# Patient Record
Sex: Female | Born: 1985 | Race: White | Hispanic: No | Marital: Married | Smoking: Current every day smoker
Health system: Southern US, Community
[De-identification: ages and names within clinical notes are randomized; demographics above are authoritative.]

## PROBLEM LIST (undated history)

## (undated) ENCOUNTER — Inpatient Hospital Stay: Payer: Self-pay

## (undated) DIAGNOSIS — I82890 Acute embolism and thrombosis of other specified veins: Secondary | ICD-10-CM

## (undated) HISTORY — DX: Acute embolism and thrombosis of other specified veins: I82.890

## (undated) HISTORY — PX: OTHER SURGICAL HISTORY: SHX169

---
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
## 2006-08-31 ENCOUNTER — Encounter: Payer: Self-pay | Admitting: Maternal and Fetal Medicine

## 2006-11-22 ENCOUNTER — Observation Stay: Payer: Self-pay | Admitting: Obstetrics and Gynecology

## 2006-11-24 ENCOUNTER — Inpatient Hospital Stay: Payer: Self-pay | Admitting: Obstetrics and Gynecology

## 2008-04-25 ENCOUNTER — Emergency Department: Payer: Self-pay | Admitting: Emergency Medicine

## 2008-05-09 ENCOUNTER — Emergency Department: Payer: Self-pay | Admitting: Emergency Medicine

## 2010-04-19 ENCOUNTER — Emergency Department: Payer: Self-pay | Admitting: Emergency Medicine

## 2011-09-04 ENCOUNTER — Emergency Department: Payer: Self-pay | Admitting: Emergency Medicine

## 2012-09-23 ENCOUNTER — Emergency Department: Payer: Self-pay | Admitting: Emergency Medicine

## 2012-09-23 LAB — BASIC METABOLIC PANEL
Anion Gap: 5 — ABNORMAL LOW (ref 7–16)
BUN: 8 mg/dL (ref 7–18)
Calcium, Total: 8.9 mg/dL (ref 8.5–10.1)
Chloride: 107 mmol/L (ref 98–107)
Co2: 24 mmol/L (ref 21–32)
Creatinine: 0.67 mg/dL (ref 0.60–1.30)
EGFR (African American): >60
EGFR (Non-African Amer.): >60
Glucose: 86 mg/dL (ref 65–99)
Osmolality: 270 (ref 275–301)
Potassium: 4.2 mmol/L (ref 3.5–5.1)
Sodium: 136 mmol/L (ref 136–145)

## 2012-09-23 LAB — CBC
HCT: 40.1 % (ref 35.0–47.0)
HGB: 13.9 g/dL (ref 12.0–16.0)
MCH: 32.7 pg (ref 26.0–34.0)
MCHC: 34.7 g/dL (ref 32.0–36.0)
MCV: 94 fL (ref 80–100)
Platelet: 248 10*3/uL (ref 150–440)
RBC: 4.25 10*6/uL (ref 3.80–5.20)
RDW: 14.1 % (ref 11.5–14.5)
WBC: 8.1 10*3/uL (ref 3.6–11.0)

## 2012-09-23 LAB — DRUG SCREEN, URINE

## 2012-09-23 LAB — TROPONIN I: Troponin-I: <0.02 ng/mL

## 2013-03-25 ENCOUNTER — Emergency Department: Payer: Self-pay | Admitting: Emergency Medicine

## 2013-03-25 LAB — URINALYSIS, COMPLETE
Bilirubin,UR: NEGATIVE
Glucose,UR: NEGATIVE mg/dL (ref 0–75)
Ketone: NEGATIVE
Nitrite: NEGATIVE
Ph: 5 (ref 4.5–8.0)
Protein: 100
RBC,UR: 13 /HPF (ref 0–5)
Specific Gravity: 1.029 (ref 1.003–1.030)
Squamous Epithelial: 31
WBC UR: 124 /HPF (ref 0–5)

## 2013-03-25 LAB — COMPREHENSIVE METABOLIC PANEL
Albumin: 3.7 g/dL (ref 3.4–5.0)
Alkaline Phosphatase: 67 U/L
Anion Gap: 4 — ABNORMAL LOW (ref 7–16)
BUN: 8 mg/dL (ref 7–18)
Bilirubin,Total: 0.7 mg/dL (ref 0.2–1.0)
Calcium, Total: 8.9 mg/dL (ref 8.5–10.1)
Chloride: 99 mmol/L (ref 98–107)
Co2: 27 mmol/L (ref 21–32)
Creatinine: 0.7 mg/dL (ref 0.60–1.30)
EGFR (African American): >60
EGFR (Non-African Amer.): >60
Glucose: 83 mg/dL (ref 65–99)
Osmolality: 258 (ref 275–301)
Potassium: 3.7 mmol/L (ref 3.5–5.1)
SGOT(AST): 16 U/L (ref 15–37)
SGPT (ALT): 12 U/L (ref 12–78)
Sodium: 130 mmol/L — ABNORMAL LOW (ref 136–145)
Total Protein: 8.1 g/dL (ref 6.4–8.2)

## 2013-03-25 LAB — CBC WITH DIFFERENTIAL/PLATELET
Basophil #: 0 10*3/uL (ref 0.0–0.1)
Basophil %: 0.5 %
Eosinophil #: 0 10*3/uL (ref 0.0–0.7)
Eosinophil %: 0.3 %
HCT: 35.9 % (ref 35.0–47.0)
HGB: 12.5 g/dL (ref 12.0–16.0)
Lymphocyte #: 1 10*3/uL (ref 1.0–3.6)
Lymphocyte %: 9.4 %
MCH: 32.4 pg (ref 26.0–34.0)
MCHC: 34.6 g/dL (ref 32.0–36.0)
MCV: 94 fL (ref 80–100)
Monocyte #: 1 x10 3/mm — ABNORMAL HIGH (ref 0.2–0.9)
Monocyte %: 9.1 %
Neutrophil #: 8.5 10*3/uL — ABNORMAL HIGH (ref 1.4–6.5)
Neutrophil %: 80.7 %
Platelet: 214 10*3/uL (ref 150–440)
RBC: 3.84 10*6/uL (ref 3.80–5.20)
RDW: 13.8 % (ref 11.5–14.5)
WBC: 10.6 10*3/uL (ref 3.6–11.0)

## 2013-03-25 LAB — HCG, QUANTITATIVE, PREGNANCY: Beta Hcg, Quant.: 185257 m[IU]/mL — ABNORMAL HIGH

## 2013-03-27 LAB — URINE CULTURE

## 2013-05-31 ENCOUNTER — Observation Stay: Payer: Self-pay | Admitting: Obstetrics and Gynecology

## 2013-10-13 ENCOUNTER — Encounter: Payer: Self-pay | Admitting: Obstetrics & Gynecology

## 2013-10-17 ENCOUNTER — Observation Stay: Payer: Self-pay

## 2013-10-17 LAB — URINALYSIS, COMPLETE
Bacteria: NONE SEEN
Bilirubin,UR: NEGATIVE
Blood: NEGATIVE
Glucose,UR: NEGATIVE mg/dL (ref 0–75)
Ketone: NEGATIVE
Leukocyte Esterase: NEGATIVE
Nitrite: NEGATIVE
Ph: 6 (ref 4.5–8.0)
Protein: NEGATIVE
RBC,UR: 1 /HPF (ref 0–5)
Specific Gravity: 1.019 (ref 1.003–1.030)
Squamous Epithelial: 5
WBC UR: 1 /HPF (ref 0–5)

## 2013-11-15 ENCOUNTER — Inpatient Hospital Stay: Payer: Self-pay | Admitting: Obstetrics & Gynecology

## 2013-11-15 LAB — CBC WITH DIFFERENTIAL/PLATELET
Basophil #: 0.1 10*3/uL (ref 0.0–0.1)
Basophil %: 0.6 %
Eosinophil #: 0 10*3/uL (ref 0.0–0.7)
Eosinophil %: 0.4 %
HCT: 32.1 % — ABNORMAL LOW (ref 35.0–47.0)
HGB: 10.7 g/dL — ABNORMAL LOW (ref 12.0–16.0)
Lymphocyte #: 1.7 10*3/uL (ref 1.0–3.6)
Lymphocyte %: 17.3 %
MCH: 28.8 pg (ref 26.0–34.0)
MCHC: 33.2 g/dL (ref 32.0–36.0)
MCV: 87 fL (ref 80–100)
Monocyte #: 0.9 x10 3/mm (ref 0.2–0.9)
Monocyte %: 9.3 %
Neutrophil #: 7 10*3/uL — ABNORMAL HIGH (ref 1.4–6.5)
Neutrophil %: 72.4 %
Platelet: 172 10*3/uL (ref 150–440)
RBC: 3.7 10*6/uL — ABNORMAL LOW (ref 3.80–5.20)
RDW: 14.7 % — ABNORMAL HIGH (ref 11.5–14.5)
WBC: 9.7 10*3/uL (ref 3.6–11.0)

## 2013-11-15 LAB — GC/CHLAMYDIA PROBE AMP

## 2013-11-17 LAB — HEMATOCRIT: HCT: 29.4 % — ABNORMAL LOW (ref 35.0–47.0)

## 2013-11-25 ENCOUNTER — Observation Stay: Payer: Self-pay | Admitting: Obstetrics & Gynecology

## 2013-11-25 LAB — CBC
HCT: 36.9 % (ref 35.0–47.0)
HGB: 11.8 g/dL — ABNORMAL LOW (ref 12.0–16.0)
MCH: 27.8 pg (ref 26.0–34.0)
MCHC: 31.9 g/dL — ABNORMAL LOW (ref 32.0–36.0)
MCV: 87 fL (ref 80–100)
Platelet: 304 10*3/uL (ref 150–440)
RBC: 4.24 10*6/uL (ref 3.80–5.20)
RDW: 14.8 % — ABNORMAL HIGH (ref 11.5–14.5)
WBC: 13.9 10*3/uL — ABNORMAL HIGH (ref 3.6–11.0)

## 2013-11-25 LAB — COMPREHENSIVE METABOLIC PANEL
Albumin: 3 g/dL — ABNORMAL LOW (ref 3.4–5.0)
Alkaline Phosphatase: 141 U/L — ABNORMAL HIGH
Anion Gap: 8 (ref 7–16)
BUN: 12 mg/dL (ref 7–18)
Bilirubin,Total: 0.4 mg/dL (ref 0.2–1.0)
Calcium, Total: 8.4 mg/dL — ABNORMAL LOW (ref 8.5–10.1)
Chloride: 103 mmol/L (ref 98–107)
Co2: 26 mmol/L (ref 21–32)
Creatinine: 0.83 mg/dL (ref 0.60–1.30)
EGFR (African American): >60
EGFR (Non-African Amer.): >60
Glucose: 129 mg/dL — ABNORMAL HIGH (ref 65–99)
Osmolality: 275 (ref 275–301)
Potassium: 3.7 mmol/L (ref 3.5–5.1)
SGOT(AST): 18 U/L (ref 15–37)
SGPT (ALT): 24 U/L
Sodium: 137 mmol/L (ref 136–145)
Total Protein: 7.2 g/dL (ref 6.4–8.2)

## 2013-11-25 LAB — URINALYSIS, COMPLETE
Bacteria: NONE SEEN
Bilirubin,UR: NEGATIVE
Glucose,UR: NEGATIVE mg/dL (ref 0–75)
Ketone: NEGATIVE
Nitrite: NEGATIVE
Ph: 6 (ref 4.5–8.0)
Protein: NEGATIVE
RBC,UR: 5 /HPF (ref 0–5)
Specific Gravity: 1.015 (ref 1.003–1.030)
Squamous Epithelial: 1
WBC UR: 97 /HPF (ref 0–5)

## 2013-11-25 LAB — WET PREP, GENITAL

## 2013-11-25 LAB — LIPASE, BLOOD: Lipase: 115 U/L (ref 73–393)

## 2013-11-26 LAB — CBC WITH DIFFERENTIAL/PLATELET
Basophil #: 0 x10 3/mm 3
Basophil %: 0.4 %
Eosinophil #: 0.2 x10 3/mm 3
Eosinophil %: 1.6 %
HCT: 31.8 % — ABNORMAL LOW
HGB: 10.4 g/dL — ABNORMAL LOW
Lymphocyte %: 14.5 %
Lymphs Abs: 1.5 x10 3/mm 3
MCH: 28.7 pg
MCHC: 32.8 g/dL
MCV: 87 fL
Monocyte #: 0.8 "x10 3/mm "
Monocyte %: 8 %
Neutrophil #: 7.6 x10 3/mm 3 — ABNORMAL HIGH
Neutrophil %: 75.5 %
Platelet: 255 x10 3/mm 3
RBC: 3.64 X10 6/mm 3 — ABNORMAL LOW
RDW: 14.7 % — ABNORMAL HIGH
WBC: 10.1 x10 3/mm 3

## 2013-11-27 LAB — CBC WITH DIFFERENTIAL/PLATELET
Basophil #: 0 10*3/uL (ref 0.0–0.1)
Basophil %: 0.6 %
Eosinophil #: 0.2 10*3/uL (ref 0.0–0.7)
Eosinophil %: 2.6 %
HCT: 32.8 % — ABNORMAL LOW (ref 35.0–47.0)
HGB: 10.4 g/dL — ABNORMAL LOW (ref 12.0–16.0)
Lymphocyte #: 2 10*3/uL (ref 1.0–3.6)
Lymphocyte %: 24.5 %
MCH: 27.6 pg (ref 26.0–34.0)
MCHC: 31.7 g/dL — ABNORMAL LOW (ref 32.0–36.0)
MCV: 87 fL (ref 80–100)
Monocyte #: 0.7 x10 3/mm (ref 0.2–0.9)
Monocyte %: 8.6 %
Neutrophil #: 5.3 10*3/uL (ref 1.4–6.5)
Neutrophil %: 63.7 %
Platelet: 261 10*3/uL (ref 150–440)
RBC: 3.77 10*6/uL — ABNORMAL LOW (ref 3.80–5.20)
RDW: 14.9 % — ABNORMAL HIGH (ref 11.5–14.5)
WBC: 8.3 10*3/uL (ref 3.6–11.0)

## 2013-11-27 LAB — GC/CHLAMYDIA PROBE AMP

## 2013-11-27 LAB — URINE CULTURE

## 2013-11-30 LAB — CULTURE, BLOOD (SINGLE)

## 2014-04-29 NOTE — H&P (Signed)
Subjective/Chief Complaint -CC: abdominal pain   History of Present Illness -HPI: 29 y/o [id number] PPD 11/16/2013 with above CC. PMHx significant for need for manual extraction of placenta after SVD. Patient states pain came on suddenly at 0600 today and feels pressure like and stable in intensity.   No fevers, chills, n/v/, dysuria and patient with decreasing lochia   Past History none   Primary Physician [facility]   Past Med/Surgical Hx:  denies:   denies:   ALLERGIES:  No Known Allergies:   Family and Social History:  Family History Non-Contributory   Physical Exam:  GEN no acute distress   RESP normal resp effort  clear BS   CARD regular rate  no murmur   ABD +fundal tenderness with fundus below the umbilicus. no reb/guarding, +BS   GU no suprapubic tenderness. speculum exam by er had lochia   Additional Comments no CVAT bilaterally   Lab Results:  LabObservation:  20-Nov-15 08:29   OBSERVATION PACS Image dcm.pi=[id number].sa=[id number]  Hepatic:  20-Nov-15 07:23   Bilirubin, Total 0.4  Alkaline Phosphatase  141 (46-116 NOTE: New Reference Range 07/26/13)  SGPT (ALT) 24 (14-63 NOTE: New Reference Range 07/26/13)  SGOT (AST) 18  Total Protein, Serum 7.2  Albumin, Serum  3.0  Routine BB:  20-Nov-15 07:23   ABO Group + Rh Type O Positive  Antibody Screen NEGATIVE (Result(s) reported on 25 Nov 2013 at 11:23AM.)  Routine Micro:  20-Nov-15 07:45   Micro Text Report WET PREP   COMMENT                   MANY WHITE BLOOD CELLS SEEN   COMMENT                   CLUE CELLS SEEN   COMMENT                   NO SPERMATOZOA SEEN   COMMENT                   NO TRICHOMONAS SEEN   COMMENT                   NO YEAST SEEN   ANTIBIOTIC                       Comment 1. MANY WHITE BLOOD CELLS SEEN  Comment 2. CLUE CELLS SEEN  Comment 3. NO SPERMATOZOA SEEN  Comment 4. NO TRICHOMONAS SEEN  Comment 5. NO YEAST SEEN  Result(s) reported on 25 Nov 2013 at 08:31AM.   Routine Chem:  20-Nov-15 07:23   Lipase 115 (Result(s) reported on 25 Nov 2013 at 07:49AM.)  Glucose, Serum  129  BUN 12  Creatinine (comp) 0.83  Sodium, Serum 137  Potassium, Serum 3.7  Chloride, Serum 103  CO2, Serum 26  Calcium (Total), Serum  8.4  Osmolality (calc) 275  eGFR (African American) >60  eGFR (Non-African American) >60 (eGFR values <78m/min/1.73 m2 may be an indication of chronic kidney disease (CKD). Calculated eGFR, using the MRDR Study equation, is useful in  patients with stable renal function. The eGFR calculation will not be reliable in acutely ill patients when serum creatinine is changing rapidly. It is not useful in patients on dialysis. The eGFR calculation may not be applicable to patients at the low and high extremes of body sizes, pregnant women, and vegetarians.)  Anion Gap 8  Routine Hem:  20-Nov-15 07:23   WBC (  CBC)  13.9  RBC (CBC) 4.24  Hemoglobin (CBC)  11.8  Hematocrit (CBC) 36.9  Platelet Count (CBC) 304 (Result(s) reported on 25 Nov 2013 at 07:40AM.)  MCV 87  MCH 27.8  MCHC  31.9  RDW  14.8   Radiology Results: [place]:    20-Nov-15 08:29, US Pelvis - NON OB  US Pelvis - NON OB  REASON FOR EXAM:    ?retained placenta - SVD 10 days ago, manual   extraction of placenta performed, n  COMMENTS:       PROCEDURE: [place]  - US PELVIS EXAM  - Nov 25 2013  8:29AM     CLINICAL DATA:  Initial encounter for fever and leukocytosis 9 days  after spontaneous vaginal delivery.    EXAM:  TRANSABDOMINAL ULTRASOUND OF PELVIS    TECHNIQUE:  Transabdominal ultrasound examination of the pelvis was performed  including evaluation of the uterus, ovaries, adnexal regions, and  pelvic cul-de-sac.    COMPARISON:  None.    FINDINGS:  Uterus    Measurements: Limb 0.3 x 8.5 x 9.2 cm. No fibroids or other mass  visualized.    Endometrium    Thickness: The endometrial echo complex is markedly thickened at 4.4  cm with heterogeneous material within the  endometrial canal. This is  a combination of hypoechoic and hyper echoic debris. Some areas  appear fairly homogeneous and similar in echogenicity to the  background myometrium. Color Doppler evaluation does not show marked  hypervascularity within the thickened endometrial echo complex, but  retained products can be hypovascular.    Right ovary    Measurements: 4.7 x 2.7 x 2.0 cm. Normal appearance/no adnexal mass.    Left ovary    Measurements: Nonvisualized.    Other findings:  No evidence for free fluid in the cul-de-sac.   IMPRESSION:  Markedly thickened endometrial echo complex is suggestive for  retained products of conception although there is ultrasound imaging  overlap between retained products of conception and blood clot.  Color Doppler assessment does not reveal hyper vascularity within  the endometrial echo complex, but retained products of conception  can be hypovascular. Superinfection/ endometritis cannot be excluded  by imaging.      Electronically Signed    By: Misty Stanley M.D.    On: 11/25/2013 08:40       Verified By: ERIC A. MANSELL, M.D.,  LabUnknown:    20-Mar-15 18:22, US OB Less Than 14 Weeks  PACS Image    Assessment/Admission Diagnosis endometritis vs rPOCs   Plan *OB: routine care *ID: started ertapenem in the ER. possible for need for surgery but will follow up s/s and see if improves and follow wbc. patient currently AF with normal and stable VS *Pain: PO prns *FEN/GI: okay to eat. MIVF *PPx: OOB ad lib *Dispo: possibly tomorrow or sunday   Electronic Signatures: Aletha Halim (MD)  (Signed 20-Nov-15 12:17)  Authored: CHIEF COMPLAINT and HISTORY, PAST MEDICAL/SURGIAL HISTORY, ALLERGIES, HOME MEDICATIONS, FAMILY AND SOCIAL HISTORY, PHYSICAL EXAM, LABS, Radiology, ASSESSMENT AND PLAN   Last Updated: 20-Nov-15 12:17 by Aletha Halim (MD)

## 2014-05-16 NOTE — H&P (Signed)
L&D Evaluation:  History:  HPI Pt is a 29 yo G4P2012 at [redacted] weeks GA with an EDC of 11/07/13 based on a 3181w6d u/s presents to L&D with reports of vaginal pain. The patient reports that the pain started last night around midnight. She reports having intercourse after the pain started to try to get labor to start. She reports the pain going away but coming back today. She reports +FM, denies vb, lof or ctx. Prenatal course is significant for late entry to care at 19 weeks, UTI, history of macrosomia. Pt with a growth scan on 9/25 with a BPD at 88% and AC at 95% EFW was 2871g. She is O+, RI, VI, GBS + in her urine. She declined her Tdap this pregnancy.   Presents with vaginal pain   Patient's Medical History No Chronic Illness   Patient's Surgical History none   Medications Pre Natal Vitamins   Allergies NKDA   Social History none   Family History Non-Contributory   ROS:  ROS All systems were reviewed.  HEENT, CNS, GI, GU, Respiratory, CV, Renal and Musculoskeletal systems were found to be normal.   Exam:  Vital Signs stable   General no apparent distress   Mental Status clear   Chest clear   Heart normal sinus rhythm   Abdomen gravid, non-tender   Pelvic no external lesions, cervix closed and thick   Mebranes Intact   FHT normal rate with no decels, 135, +accels, no decels, moderate variability   Ucx absent   Skin dry, no lesions, no rashes   Lymph no lymphadenopathy   Impression:  Impression reactive NST, IUP at 37 weeks, cat 1 NST, vaginal pain   Plan:  Plan discharge   Follow Up Appointment need to schedule   Electronic Signatures: Jannet MantisSubudhi, Haelee Bolen (CNM)  (Signed 12-Oct-15 16:31)  Authored: L&D Evaluation   Last Updated: 12-Oct-15 16:31 by Jannet MantisSubudhi, Allex Lapoint (CNM)

## 2014-05-16 NOTE — H&P (Signed)
L&D Evaluation:  History Expanded:  HPI 29 yo G3P2002 at roughly 2824 weeks gestational age by rough LMP. She has had no prenatal care.  She presents with decreased fetal movement for 2 days. She noted movement upon arrival to L&D.  She notes no contractions, no vaginal bleeding, no leakage of fluid.  She has had enterd into care yet due to [facility] "[facility]   Patient's Medical History No Chronic Illness   Patient's Surgical History none   Medications Pre Natal Vitamins   Allergies NKDA   Social History quit smoking in February   Family History Non-Contributory   ROS:  ROS All systems were reviewed.  HEENT, CNS, GI, GU, Respiratory, CV, Renal and Musculoskeletal systems were found to be normal., unless noted in HPI   Exam:  Vital Signs afebrile, normotensive, otherwise normal   Urine Protein not completed   General no apparent distress   Mental Status clear   Chest moves air well   Abdomen gravid, non-tender   Edema no edema   FHT normal rate with no decels   FHT Description 145   Ucx absent   Impression:  Impression 1) Intrauterine pregnancy at 5024 weeks gestational age, 2) fetus with reassuring heart rate for gestational age   Plan:  Comments 1) no prenatal care: strongly recommended that patient get into care as soon as possible.   2) decreased fetal movement: reassured patient.   Follow Up Appointment need to schedule. as soon as possible to establish care at health department.   Electronic Signatures: Conard NovakJackson, Aniya Jolicoeur D (MD)  (Signed 26-May-15 20:37)  Authored: L&D Evaluation   Last Updated: 26-May-15 20:37 by Conard NovakJackson, Katiejo Gilroy D (MD)

## 2014-07-09 ENCOUNTER — Emergency Department: Payer: Medicaid Other

## 2014-07-09 ENCOUNTER — Encounter: Payer: Self-pay | Admitting: Emergency Medicine

## 2014-07-09 ENCOUNTER — Emergency Department
Admission: EM | Admit: 2014-07-09 | Discharge: 2014-07-09 | Disposition: A | Discharge status: Home / Self Care | Payer: Medicaid Other | Attending: Student | Admitting: Student

## 2014-07-09 DIAGNOSIS — N8320 Unspecified ovarian cysts: Secondary | ICD-10-CM | POA: Insufficient documentation

## 2014-07-09 DIAGNOSIS — N390 Urinary tract infection, site not specified: Secondary | ICD-10-CM | POA: Insufficient documentation

## 2014-07-09 DIAGNOSIS — R52 Pain, unspecified: Secondary | ICD-10-CM

## 2014-07-09 DIAGNOSIS — R109 Unspecified abdominal pain: Secondary | ICD-10-CM | POA: Diagnosis present

## 2014-07-09 DIAGNOSIS — Z720 Tobacco use: Secondary | ICD-10-CM | POA: Diagnosis not present

## 2014-07-09 DIAGNOSIS — N83201 Unspecified ovarian cyst, right side: Secondary | ICD-10-CM

## 2014-07-09 LAB — COMPREHENSIVE METABOLIC PANEL
ALT: 9 U/L — ABNORMAL LOW (ref 14–54)
AST: 16 U/L (ref 15–41)
Albumin: 4.2 g/dL (ref 3.5–5.0)
Alkaline Phosphatase: 61 U/L (ref 38–126)
Anion gap: 9 (ref 5–15)
BUN: 13 mg/dL (ref 6–20)
CO2: 24 mmol/L (ref 22–32)
Calcium: 9.2 mg/dL (ref 8.9–10.3)
Chloride: 103 mmol/L (ref 101–111)
Creatinine, Ser: 0.81 mg/dL (ref 0.44–1.00)
GFR calc Af Amer: >60 mL/min (ref >60)
GFR calc non Af Amer: >60 mL/min (ref >60)
Glucose, Bld: 88 mg/dL (ref 65–99)
Potassium: 3.7 mmol/L (ref 3.5–5.1)
Sodium: 136 mmol/L (ref 135–145)
Total Bilirubin: 0.5 mg/dL (ref 0.3–1.2)
Total Protein: 7.6 g/dL (ref 6.5–8.1)

## 2014-07-09 LAB — CBC WITH DIFFERENTIAL/PLATELET
Basophils Absolute: 0.1 10*3/uL (ref 0–0.1)
Basophils Relative: 1 %
Eosinophils Absolute: 0.2 10*3/uL (ref 0–0.7)
Eosinophils Relative: 2 %
HCT: 39.6 % (ref 35.0–47.0)
Hemoglobin: 13.1 g/dL (ref 12.0–16.0)
Lymphocytes Relative: 32 %
Lymphs Abs: 2.8 10*3/uL (ref 1.0–3.6)
MCH: 31 pg (ref 26.0–34.0)
MCHC: 33.1 g/dL (ref 32.0–36.0)
MCV: 93.6 fL (ref 80.0–100.0)
Monocytes Absolute: 0.7 10*3/uL (ref 0.2–0.9)
Monocytes Relative: 8 %
Neutro Abs: 4.8 10*3/uL (ref 1.4–6.5)
Neutrophils Relative %: 57 %
Platelets: 269 10*3/uL (ref 150–440)
RBC: 4.23 MIL/uL (ref 3.80–5.20)
RDW: 13.9 % (ref 11.5–14.5)
WBC: 8.6 10*3/uL (ref 3.6–11.0)

## 2014-07-09 LAB — URINALYSIS COMPLETE WITH MICROSCOPIC (ARMC ONLY)
Bilirubin Urine: NEGATIVE
Glucose, UA: NEGATIVE mg/dL
Hgb urine dipstick: NEGATIVE
Nitrite: POSITIVE — AB
Protein, ur: 30 mg/dL — AB
Specific Gravity, Urine: 1.029 (ref 1.005–1.030)
pH: 6 (ref 5.0–8.0)

## 2014-07-09 LAB — HCG, QUANTITATIVE, PREGNANCY: hCG, Beta Chain, Quant, S: <1 m[IU]/mL (ref <5)

## 2014-07-09 MED ORDER — NITROFURANTOIN MONOHYD MACRO 100 MG PO CAPS
100.0000 mg | ORAL_CAPSULE | Freq: Once | ORAL | Status: AC
  Administered 2014-07-09: 100 mg via ORAL

## 2014-07-09 MED ORDER — IBUPROFEN 600 MG PO TABS: 600.0000 mg | ORAL_TABLET | Freq: Four times a day (QID) | ORAL | Status: DC | PRN

## 2014-07-09 MED ORDER — NITROFURANTOIN MONOHYD MACRO 100 MG PO CAPS
ORAL_CAPSULE | ORAL | Status: AC
  Filled 2014-07-09: qty 1

## 2014-07-09 MED ORDER — NITROFURANTOIN MONOHYD MACRO 100 MG PO CAPS: 100.0000 mg | ORAL_CAPSULE | Freq: Two times a day (BID) | ORAL | Status: DC

## 2014-07-09 NOTE — ED Provider Notes (Addendum)
[facility] Emergency Department Provider Note  ____________________________________________  Time seen: Approximately 4:22 AM  I have reviewed the triage vital signs and the nursing notes.   HISTORY  Chief Complaint Abdominal Pain    HPI Desiree Mosley is a 29 y.o. female no chronic medical problems presents for evaluation of suprapubic, right lower quadrant and anal pain after intercourse. Patient was having intercourse with her usual partner rectally 2 AM. She began having pain at approximately 2:20 PM. The pain was sudden in onset, has been constant since onset, currently is moderate. She reports she only had penile/vaginal penetration, no anal intercourse/penetration, no use of adjuncts/vibrators. No vaginal bleeding. Prior to today, she had been in her usual state of health. She denies any prior abdominal pain, vomiting, diarrhea, fevers, chills, dysuria.   History reviewed. No pertinent past medical history.  There are no active problems to display for this patient.   History reviewed. No pertinent past surgical history.  No current outpatient prescriptions on file.  Allergies Review of patient's allergies indicates no known allergies.  History reviewed. No pertinent family history.  Social History History  Substance Use Topics  . Smoking status: Current Every Day Smoker  . Smokeless tobacco: Never Used  . Alcohol Use: No    Review of Systems Constitutional: No fever/chills Eyes: No visual changes. ENT: No sore throat. Cardiovascular: Denies chest pain. Respiratory: Denies shortness of breath. Gastrointestinal: + abdominal pain.  No nausea, no vomiting.  No diarrhea.  No constipation. Genitourinary: Negative for dysuria. Musculoskeletal: Negative for back pain. Skin: Negative for rash. Neurological: Negative for headaches, focal weakness or numbness.  10-point ROS otherwise  negative.  ____________________________________________   PHYSICAL EXAM:  VITAL SIGNS: ED Triage Vitals  Enc Vitals Group     BP 07/09/14 0317 102/61 mmHg     Pulse Rate 07/09/14 0317 80     Resp 07/09/14 0317 16     Temp 07/09/14 0317 97.8 F (36.6 C)     Temp src --      SpO2 07/09/14 0317 98 %     Weight 07/09/14 0317 145 lb (65.772 kg)     Height 07/09/14 0317  (1.676 m)     Head Cir --      Peak Flow --      Pain Score 07/09/14 0318 7     Pain Loc --      Pain Edu? --      Excl. in GC? --     Constitutional: Alert and oriented. Well appearing and in no acute distress. Eyes: Conjunctivae are normal. PERRL. EOMI. Head: Atraumatic. Nose: No congestion/rhinnorhea. Mouth/Throat: Mucous membranes are moist.  Oropharynx non-erythematous. Neck: No stridor.  Cardiovascular: Normal rate, regular rhythm. Grossly normal heart sounds.  Good peripheral circulation. Respiratory: Normal respiratory effort.  No retractions. Lungs CTAB. Gastrointestinal: Soft with moderate suprapubic and RLQ tenderness without rebound. No distention. No abdominal bruits. No CVA tenderness. Pelvic: no intravaginal laceration, no bleeding or ulceration, no foreign body; IUD strings visualized exam closed os, thin non-malodorous vaginal discharge, no bimanual cervical motion tenderness. Rectal: no FB appreciated, no gross blood, no tenderness Musculoskeletal: No lower extremity tenderness nor edema.  No joint effusions. Neurologic:  Normal speech and language. No gross focal neurologic deficits are appreciated. Speech is normal. No gait instability. Skin:  Skin is warm, dry and intact. No rash noted. Psychiatric: Mood and affect are normal. Speech and behavior are normal.  ____________________________________________   LABS (all labs ordered  are listed, but only abnormal results are displayed)  Labs Reviewed  URINALYSIS COMPLETEWITH MICROSCOPIC ([facility] ONLY) - Abnormal; Notable for the following:     Color, Urine YELLOW (*)    APPearance CLEAR (*)    Ketones, ur TRACE (*)    Protein, ur 30 (*)    Nitrite POSITIVE (*)    Leukocytes, UA TRACE (*)    Bacteria, UA MANY (*)    Squamous Epithelial / LPF 0-5 (*)    All other components within normal limits  COMPREHENSIVE METABOLIC PANEL - Abnormal; Notable for the following:    ALT 9 (*)    All other components within normal limits  CBC WITH DIFFERENTIAL/PLATELET  HCG, QUANTITATIVE, PREGNANCY   ____________________________________________  EKG  none ____________________________________________  RADIOLOGY  Pelvic ultrasound FINDINGS: Uterus  Measurements: 9.0 x 3.3 x 5.9 cm. No fibroids or other mass visualized.  Endometrium  Thickness: 2.6 mm. No focal abnormality. IUD appropriately positioned.  Right ovary  Measurements: 3.6 x 2.4 x 2.6 cm. 4 x 7 mm complex cyst or follicle noted. This may be hemorrhagic.  Left ovary  Measurements: 3.4 x 2.3 x 2.8 cm. Normal appearance/no adnexal mass.  Pulsed Doppler evaluation of both ovaries demonstrates normal low-resistance arterial and venous waveforms.  Other findings  No free fluid.  IMPRESSION: 4 x 7 mm complex right ovarian cyst. This could be hemorrhagic. Otherwise normal uterus and ovaries. IUD appropriately positioned.   1 view upright  Abdominal plain film FINDINGS: The bowel gas pattern is normal. No radio-opaque calculi or other significant radiographic abnormality are seen.  IMPRESSION: Negative. ____________________________________________   PROCEDURES  Procedure(s) performed: None  Critical Care performed: No  ____________________________________________   INITIAL IMPRESSION / ASSESSMENT AND PLAN / ED COURSE  Pertinent labs & imaging results that were available during my care of the patient were reviewed by me and considered in my medical decision making (see chart for details).  Desiree Mosley is a 29 y.o. female no chronic  medical problems presents for evaluation of suprapubic, right lower quadrant and anal pain after intercourse. On exam, she is generally well-appearing and in no acute distress, texting on her phone. Vital signs stable, she is afebrile. She has moderate tenderness to palpation in the suprapubic region as well as mild tenderness in the right lower quadrant. Pelvic exam is unrevealing, no foreign body, no laceration or ulceration, visualized IUD strings in place. Normal rectal exam. Blood work reviewed and is generally unremarkable. Urinalysis consistent with nitrite positive UTI which may very well be the cause of her suprapubic abdominal pain however will obtain upright KUB film to rule out perforation and pelvic ultrasound to rule out torsion.  ----------------------------------------- 6:10 AM on 07/09/2014 -----------------------------------------  Ultrasound shows 4 x 7 mm complex right ovarian cyst, no evidence of torsion. Patient continues to appear well, she has declined any pain medications in the ER. We'll discharge with macrobid, GYN follow-up. Return precautions discussed and she is comfortable with discharge plan. No leukocytosis, vomiting or diarrhea and I doubt appendicitis (especially given visualization of right of ovarian cyst today) however we discussed immediate return for worsening symptoms as this has not been definitively ruled out during this visit - she voices understanding. ____________________________________________   FINAL CLINICAL IMPRESSION(S) / ED DIAGNOSES  Final diagnoses:  Pain  UTI (lower urinary tract infection)      Gayla DossEryka A Theresa Dohrman, MD 07/09/14 [id number]  Gayla DossEryka A Jozeph Persing, MD 07/09/14 [phone]

## 2014-07-09 NOTE — ED Notes (Signed)
Pt returned from ct and us. Pt states pain is improved.

## 2014-07-09 NOTE — ED Notes (Signed)
Pt  States was having sexual intercourse around 2am, at approx 220 pt began to experience right lower quadrant pain and rectal pain. Pt denies nausea, vomiting, fever, diarrhea, chills.

## 2014-07-09 NOTE — ED Notes (Addendum)
Patient transported to Ultrasound 

## 2014-07-11 LAB — URINE CULTURE
Culture: >=100000
Special Requests: NORMAL

## 2015-02-14 ENCOUNTER — Emergency Department
Admission: EM | Admit: 2015-02-14 | Discharge: 2015-02-14 | Disposition: A | Discharge status: Home / Self Care | Payer: Medicaid Other | Attending: Emergency Medicine | Admitting: Emergency Medicine

## 2015-02-14 ENCOUNTER — Emergency Department: Payer: Medicaid Other

## 2015-02-14 ENCOUNTER — Encounter: Payer: Self-pay | Admitting: *Deleted

## 2015-02-14 DIAGNOSIS — N2889 Other specified disorders of kidney and ureter: Secondary | ICD-10-CM | POA: Insufficient documentation

## 2015-02-14 DIAGNOSIS — F17200 Nicotine dependence, unspecified, uncomplicated: Secondary | ICD-10-CM | POA: Diagnosis not present

## 2015-02-14 DIAGNOSIS — R109 Unspecified abdominal pain: Secondary | ICD-10-CM

## 2015-02-14 DIAGNOSIS — Z3202 Encounter for pregnancy test, result negative: Secondary | ICD-10-CM | POA: Diagnosis not present

## 2015-02-14 DIAGNOSIS — Z79899 Other long term (current) drug therapy: Secondary | ICD-10-CM | POA: Insufficient documentation

## 2015-02-14 DIAGNOSIS — N390 Urinary tract infection, site not specified: Secondary | ICD-10-CM | POA: Diagnosis not present

## 2015-02-14 DIAGNOSIS — R197 Diarrhea, unspecified: Secondary | ICD-10-CM | POA: Insufficient documentation

## 2015-02-14 DIAGNOSIS — R112 Nausea with vomiting, unspecified: Secondary | ICD-10-CM | POA: Insufficient documentation

## 2015-02-14 DIAGNOSIS — R1030 Lower abdominal pain, unspecified: Secondary | ICD-10-CM | POA: Diagnosis present

## 2015-02-14 LAB — COMPREHENSIVE METABOLIC PANEL
ALT: 13 U/L — ABNORMAL LOW (ref 14–54)
AST: 21 U/L (ref 15–41)
Albumin: 4.6 g/dL (ref 3.5–5.0)
Alkaline Phosphatase: 81 U/L (ref 38–126)
Anion gap: 8 (ref 5–15)
BUN: 14 mg/dL (ref 6–20)
CO2: 27 mmol/L (ref 22–32)
Calcium: 9.4 mg/dL (ref 8.9–10.3)
Chloride: 100 mmol/L — ABNORMAL LOW (ref 101–111)
Creatinine, Ser: 0.77 mg/dL (ref 0.44–1.00)
GFR calc Af Amer: >60 mL/min (ref >60)
GFR calc non Af Amer: >60 mL/min (ref >60)
Glucose, Bld: 96 mg/dL (ref 65–99)
Potassium: 4.1 mmol/L (ref 3.5–5.1)
Sodium: 135 mmol/L (ref 135–145)
Total Bilirubin: 1 mg/dL (ref 0.3–1.2)
Total Protein: 8.5 g/dL — ABNORMAL HIGH (ref 6.5–8.1)

## 2015-02-14 LAB — CBC
HCT: 45.4 % (ref 35.0–47.0)
Hemoglobin: 14.9 g/dL (ref 12.0–16.0)
MCH: 30.3 pg (ref 26.0–34.0)
MCHC: 32.9 g/dL (ref 32.0–36.0)
MCV: 92.3 fL (ref 80.0–100.0)
Platelets: 311 10*3/uL (ref 150–440)
RBC: 4.92 MIL/uL (ref 3.80–5.20)
RDW: 14.7 % — ABNORMAL HIGH (ref 11.5–14.5)
WBC: 12.1 10*3/uL — ABNORMAL HIGH (ref 3.6–11.0)

## 2015-02-14 LAB — BASIC METABOLIC PANEL
Anion gap: 10 (ref 5–15)
BUN: 13 mg/dL (ref 6–20)
CO2: 26 mmol/L (ref 22–32)
Calcium: 9.2 mg/dL (ref 8.9–10.3)
Chloride: 100 mmol/L — ABNORMAL LOW (ref 101–111)
Creatinine, Ser: 0.81 mg/dL (ref 0.44–1.00)
GFR calc Af Amer: >60 mL/min (ref >60)
GFR calc non Af Amer: >60 mL/min (ref >60)
Glucose, Bld: 130 mg/dL — ABNORMAL HIGH (ref 65–99)
Potassium: 3.9 mmol/L (ref 3.5–5.1)
Sodium: 136 mmol/L (ref 135–145)

## 2015-02-14 LAB — POCT PREGNANCY, URINE: Preg Test, Ur: NEGATIVE

## 2015-02-14 LAB — URINALYSIS COMPLETE WITH MICROSCOPIC (ARMC ONLY)
Bilirubin Urine: NEGATIVE
Glucose, UA: NEGATIVE mg/dL
Ketones, ur: NEGATIVE mg/dL
Leukocytes, UA: NEGATIVE
Nitrite: POSITIVE — AB
Protein, ur: NEGATIVE mg/dL
Specific Gravity, Urine: 1.029 (ref 1.005–1.030)
pH: 5 (ref 5.0–8.0)

## 2015-02-14 LAB — LIPASE, BLOOD: Lipase: 24 U/L (ref 11–51)

## 2015-02-14 MED ORDER — CEPHALEXIN 500 MG PO CAPS: 500.0000 mg | ORAL_CAPSULE | Freq: Three times a day (TID) | ORAL | Status: AC

## 2015-02-14 MED ORDER — CEPHALEXIN 500 MG PO CAPS
ORAL_CAPSULE | ORAL | Status: AC
  Administered 2015-02-14: 500 mg via ORAL
  Filled 2015-02-14: qty 1

## 2015-02-14 MED ORDER — MORPHINE SULFATE (PF) 4 MG/ML IV SOLN
4.0000 mg | Freq: Once | INTRAVENOUS | Status: AC
  Administered 2015-02-14: 4 mg via INTRAVENOUS
  Filled 2015-02-14: qty 1

## 2015-02-14 MED ORDER — ONDANSETRON HCL 4 MG/2ML IJ SOLN
4.0000 mg | Freq: Once | INTRAMUSCULAR | Status: AC
  Administered 2015-02-14: 4 mg via INTRAVENOUS
  Filled 2015-02-14: qty 2

## 2015-02-14 MED ORDER — IOHEXOL 300 MG/ML  SOLN
100.0000 mL | Freq: Once | INTRAMUSCULAR | Status: AC | PRN
  Administered 2015-02-14: 100 mL via INTRAVENOUS
  Filled 2015-02-14: qty 100

## 2015-02-14 MED ORDER — IOHEXOL 240 MG/ML SOLN
25.0000 mL | Freq: Once | INTRAMUSCULAR | Status: AC | PRN
  Administered 2015-02-14: 25 mL via ORAL
  Filled 2015-02-14: qty 25

## 2015-02-14 MED ORDER — SODIUM CHLORIDE 0.9 % IV BOLUS (SEPSIS)
1000.0000 mL | Freq: Once | INTRAVENOUS | Status: AC
  Administered 2015-02-14: 1000 mL via INTRAVENOUS

## 2015-02-14 MED ORDER — CEPHALEXIN 500 MG PO CAPS
500.0000 mg | ORAL_CAPSULE | Freq: Once | ORAL | Status: AC
  Administered 2015-02-14: 500 mg via ORAL

## 2015-02-14 MED ORDER — METOCLOPRAMIDE HCL 10 MG PO TABS: 10.0000 mg | ORAL_TABLET | Freq: Four times a day (QID) | ORAL | Status: DC | PRN

## 2015-02-14 MED ORDER — DICYCLOMINE HCL 20 MG PO TABS: 20.0000 mg | ORAL_TABLET | Freq: Three times a day (TID) | ORAL | Status: DC | PRN

## 2015-02-14 NOTE — ED Notes (Signed)
Patient with no complaints at this time. Respirations even and unlabored. Skin warm/dry. Discharge instructions reviewed with patient at this time. Patient given opportunity to voice concerns/ask questions. IV removed per policy and band-aid applied to site. Patient discharged at this time and left Emergency Department with steady gait.  

## 2015-02-14 NOTE — ED Provider Notes (Signed)
[facility] Emergency Department Provider Note  ____________________________________________  Time seen: Approximately 745 PM  I have reviewed the triage vital signs and the nursing notes.   HISTORY  Chief Complaint Dizziness    HPI Desiree Mosley is a 30 y.o. female without any chronic medical problems who is presenting tonight with near syncope at work, diarrhea nausea vomiting and abdominal pain. She says that she was feeling ill at work this morning with generalized weakness and feelings of lightheadedness when standing. She then says that she proceeded to have several episodes of diarrhea and several episodes of bilious vomiting. She is not complaining of epigastric abdominal pain as well as suprapubic abdominal pain. She denies any burning or frequency with urination. Says that she still has her gallbladder and appendix. No known sick contacts.Denies any vaginal bleeding or discharge. History reviewed. No pertinent past medical history.  There are no active problems to display for this patient.   History reviewed. No pertinent past surgical history.  Current Outpatient Rx  Name  Route  Sig  Dispense  Refill  . ibuprofen (ADVIL,MOTRIN) 600 MG tablet   Oral   Take 1 tablet (600 mg total) by mouth every 6 (six) hours as needed for moderate pain.   15 tablet   0   . nitrofurantoin, macrocrystal-monohydrate, (MACROBID) 100 MG capsule   Oral   Take 1 capsule (100 mg total) by mouth 2 (two) times daily.   10 capsule   0     Allergies Review of patient's allergies indicates no known allergies.  History reviewed. No pertinent family history.  Social History Social History  Substance Use Topics  . Smoking status: Current Every Day Smoker  . Smokeless tobacco: Never Used  . Alcohol Use: No    Review of Systems Constitutional: No fever/chills Eyes: No visual changes. ENT: No sore throat. Cardiovascular: Denies chest pain. Respiratory:  Denies shortness of breath. Gastrointestinal:   No constipation. Genitourinary: Negative for dysuria. Musculoskeletal: Negative for back pain. Skin: Negative for rash. Neurological: Negative for headaches, focal weakness or numbness.  10-point ROS otherwise negative.  ____________________________________________   PHYSICAL EXAM:  VITAL SIGNS: ED Triage Vitals  Enc Vitals Group     BP 02/14/15 1821 104/61 mmHg     Pulse Rate 02/14/15 1821 99     Resp 02/14/15 1821 20     Temp 02/14/15 1821 97.9 F (36.6 C)     Temp Source 02/14/15 1821 Oral     SpO2 02/14/15 1821 99 %     Weight 02/14/15 1821 140 lb (63.504 kg)     Height 02/14/15 1821  (1.676 m)     Head Cir --      Peak Flow --      Pain Score 02/14/15 1835 7     Pain Loc --      Pain Edu? --      Excl. in GC? --     Constitutional: Alert and oriented. Well appearing and in no acute distress. Eyes: Conjunctivae are normal. PERRL. EOMI. Head: Atraumatic. Nose: No congestion/rhinnorhea. Mouth/Throat: Mucous membranes are moist.   Neck: No stridor.   Cardiovascular: Normal rate, regular rhythm. Grossly normal heart sounds.  Good peripheral circulation. Respiratory: Normal respiratory effort.  No retractions. Lungs CTAB. Gastrointestinal: Soft with epigastric as well as suprapubic tenderness palpation which is mild to moderate. There is no rebound or guarding. There is only mild right upper quadrant tenderness with a negative Murphy sign. There is no  right lower quadrant tenderness over the McBurney's point.  No CVA tenderness. Musculoskeletal: No lower extremity tenderness nor edema.  No joint effusions. Neurologic:  Normal speech and language. No gross focal neurologic deficits are appreciated. No gait instability. Skin:  Skin is warm, dry and intact. No rash noted. Psychiatric: Mood and affect are normal. Speech and behavior are normal.  ____________________________________________   LABS (all labs ordered are  listed, but only abnormal results are displayed)  Labs Reviewed  BASIC METABOLIC PANEL - Abnormal; Notable for the following:    Chloride 100 (*)    Glucose, Bld 130 (*)    All other components within normal limits  CBC - Abnormal; Notable for the following:    WBC 12.1 (*)    RDW 14.7 (*)    All other components within normal limits  URINALYSIS COMPLETEWITH MICROSCOPIC ([facility] ONLY) - Abnormal; Notable for the following:    Color, Urine YELLOW (*)    APPearance CLEAR (*)    Hgb urine dipstick 2+ (*)    Nitrite POSITIVE (*)    Bacteria, UA MANY (*)    Squamous Epithelial / LPF 6-30 (*)    All other components within normal limits  COMPREHENSIVE METABOLIC PANEL - Abnormal; Notable for the following:    Chloride 100 (*)    Total Protein 8.5 (*)    ALT 13 (*)    All other components within normal limits  LIPASE, BLOOD  CBG MONITORING, ED  POC URINE PREG, ED  POCT PREGNANCY, URINE   ____________________________________________  EKG  ED ECG REPORT I, Schaevitz,  Teena Irani, the attending physician, personally viewed and interpreted this ECG.   Date: 02/14/2015  EKG Time: 1833  Rate: 98  Rhythm: normal EKG, normal sinus rhythm  Axis: Normal axis  Intervals:none  ST&T Change: No ST segment elevation or depression. No abnormal T-wave inversion.  ____________________________________________  RADIOLOGY IMPRESSION: 2.2 cm complex cystic abnormality seen in upper pole of right kidney with possible peripheral nodules. Possible neoplasm cannot be excluded, and further evaluation with MRI with and without gadolinium administration on nonemergent basis is recommended.  Left renal atrophy is noted.  No other abnormality seen in the abdomen or pelvis.  ____________________________________________   PROCEDURES  ____________________________________________   INITIAL IMPRESSION / ASSESSMENT AND PLAN / ED COURSE  Pertinent labs & imaging results that were available during my  care of the patient were reviewed by me and considered in my medical decision making (see chart for details).  ----------------------------------------- 10:27 PM on 02/14/2015 -----------------------------------------  Patient resting comfortably at this time. Says that the pain is relieved after the medication. She was also able to tolerate the by mouth contrast without any further vomiting. She has not had any episodes of diarrhea since being in the emergency department. I reviewed with her labs as well as imaging results. She is aware of the complex cystic abnormality of the right kidney and the possibility that this could be a cancer. She says that she has a strong history of cancer in her family but not at young ages. She was given a copy of the CAT scan report with her discharge papers and no to follow-up at the [facility] clinic for further workup and possible MRI. Likely viral cause of her current illness. I will discharge her with symptomatic treatment with Reglan and Bentyl as well as Keflex for her UTI. She understands plan and is willing to comply. ____________________________________________   FINAL CLINICAL IMPRESSION(S) / ED DIAGNOSES  Abdominal pain. Nausea  vomiting and diarrhea. UTI. Right kidney mass.    Myrna Blazer, MD 02/14/15 2228

## 2015-02-14 NOTE — ED Notes (Signed)
Pt states she got dizzy at work this AM and states upper abd pain, states vomiting and chills, pt awake and alert upon arrival

## 2015-02-14 NOTE — Discharge Instructions (Signed)

## 2015-02-19 LAB — URINE CULTURE: Culture: >=100000

## 2015-04-14 ENCOUNTER — Emergency Department
Admission: EM | Admit: 2015-04-14 | Discharge: 2015-04-14 | Disposition: A | Discharge status: Home / Self Care | Payer: Medicaid Other | Attending: Emergency Medicine | Admitting: Emergency Medicine

## 2015-04-14 DIAGNOSIS — Y939 Activity, unspecified: Secondary | ICD-10-CM | POA: Diagnosis not present

## 2015-04-14 DIAGNOSIS — M25512 Pain in left shoulder: Secondary | ICD-10-CM | POA: Diagnosis present

## 2015-04-14 DIAGNOSIS — Z792 Long term (current) use of antibiotics: Secondary | ICD-10-CM | POA: Diagnosis not present

## 2015-04-14 DIAGNOSIS — F17200 Nicotine dependence, unspecified, uncomplicated: Secondary | ICD-10-CM | POA: Insufficient documentation

## 2015-04-14 DIAGNOSIS — Y929 Unspecified place or not applicable: Secondary | ICD-10-CM | POA: Insufficient documentation

## 2015-04-14 DIAGNOSIS — Y999 Unspecified external cause status: Secondary | ICD-10-CM | POA: Insufficient documentation

## 2015-04-14 DIAGNOSIS — X58XXXA Exposure to other specified factors, initial encounter: Secondary | ICD-10-CM | POA: Insufficient documentation

## 2015-04-14 DIAGNOSIS — S46912A Strain of unspecified muscle, fascia and tendon at shoulder and upper arm level, left arm, initial encounter: Secondary | ICD-10-CM | POA: Insufficient documentation

## 2015-04-14 MED ORDER — CYCLOBENZAPRINE HCL 10 MG PO TABS
5.0000 mg | ORAL_TABLET | Freq: Once | ORAL | Status: AC
  Administered 2015-04-14: 5 mg via ORAL
  Filled 2015-04-14: qty 1

## 2015-04-14 MED ORDER — IBUPROFEN 800 MG PO TABS
800.0000 mg | ORAL_TABLET | Freq: Once | ORAL | Status: AC
  Administered 2015-04-14: 800 mg via ORAL
  Filled 2015-04-14: qty 1

## 2015-04-14 MED ORDER — HYDROCODONE-ACETAMINOPHEN 5-325 MG PO TABS
1.0000 | ORAL_TABLET | Freq: Once | ORAL | Status: AC
Start: 2015-04-14 — End: 2015-04-14
  Administered 2015-04-14: 1 via ORAL
  Filled 2015-04-14: qty 1

## 2015-04-14 MED ORDER — CYCLOBENZAPRINE HCL 5 MG PO TABS: 5.0000 mg | ORAL_TABLET | Freq: Three times a day (TID) | ORAL | Status: DC | PRN

## 2015-04-14 MED ORDER — MELOXICAM 15 MG PO TABS: 15.0000 mg | ORAL_TABLET | Freq: Every day | ORAL | Status: DC

## 2015-04-14 NOTE — ED Provider Notes (Signed)
[facility] Emergency Department Provider Note  ____________________________________________  Time seen: Approximately 4:54 PM  I have reviewed the triage vital signs and the nursing notes.   HISTORY  Chief Complaint Shoulder Pain   HPI Desiree Mosley is a 30 y.o. female presenting with left shoulder pain localized to the region of the deltoid. The pain began this morning but she is unaware of any possible mechanism of injury and denies possible chronic overuse of either arm. She describes the pain as a dull, "heavy" and consistent pain. She denies recent trauma, pain the elbow or lower portion of the affected arm, numbness or tingling. She has found no relief with tylenol and finds the pain worsens with movement. Patient states she visited the ER today because she did not want the pain to worsen at work on Monday.   No past medical history on file.  There are no active problems to display for this patient.   No past surgical history on file.  Current Outpatient Rx  Name  Route  Sig  Dispense  Refill  . cyclobenzaprine (FLEXERIL) 5 MG tablet   Oral   Take 1 tablet (5 mg total) by mouth every 8 (eight) hours as needed for muscle spasms.   30 tablet   0   . dicyclomine (BENTYL) 20 MG tablet   Oral   Take 1 tablet (20 mg total) by mouth 3 (three) times daily as needed for spasms.   15 tablet   0   . ibuprofen (ADVIL,MOTRIN) 600 MG tablet   Oral   Take 1 tablet (600 mg total) by mouth every 6 (six) hours as needed for moderate pain.   15 tablet   0   . meloxicam (MOBIC) 15 MG tablet   Oral   Take 1 tablet (15 mg total) by mouth daily.   30 tablet   0   . metoCLOPramide (REGLAN) 10 MG tablet   Oral   Take 1 tablet (10 mg total) by mouth every 6 (six) hours as needed for nausea or vomiting.   12 tablet   0   . nitrofurantoin, macrocrystal-monohydrate, (MACROBID) 100 MG capsule   Oral   Take 1 capsule (100 mg total) by mouth 2 (two) times  daily.   10 capsule   0     Allergies Review of patient's allergies indicates no known allergies.  No family history on file.  Social History Social History  Substance Use Topics  . Smoking status: Current Every Day Smoker  . Smokeless tobacco: Never Used  . Alcohol Use: No    Review of Systems Musculoskeletal: Negative for back pain, neck pain. Negative for pain the right shoulder, arm or hand. Positive for pain in the left deltoid region. Neg for pain in the left arm or hand. Neg numbness or tingling in left arm.  Neurological: Negative for headaches, focal weakness or numbness. 10-point ROS otherwise negative.  ____________________________________________   PHYSICAL EXAM:  VITAL SIGNS: ED Triage Vitals  Enc Vitals Group     BP 04/14/15 1606 113/73 mmHg     Pulse Rate 04/14/15 1606 81     Resp 04/14/15 1606 20     Temp 04/14/15 1606 98 F (36.7 C)     Temp Source 04/14/15 1606 Oral     SpO2 04/14/15 1606 100 %     Weight 04/14/15 1606 145 lb (65.772 kg)     Height 04/14/15 1606 5\' 6"  (1.676 m)     Head Cir --  Peak Flow --      Pain Score 04/14/15 1607 6     Pain Loc --      Pain Edu? --      Excl. in GC? --    Constitutional: Alert and oriented. Well appearing and in no acute distress. Neck: No cervical spine tenderness to palpation. Full cervical ROM Respiratory: Normal respiratory effort.  No retractions.  Musculoskeletal: No right upper extremity tenderness, tenderness to palpation of left deltoid. Full shoulder ROM bilat, 2+ shoulder strength bilat.  Neurologic:  Normal speech and language. No gross focal neurologic deficits are appreciated.  Skin:  Skin is warm, dry and intact. No rash noted. Psychiatric: Mood and affect are normal. Speech and behavior are normal.  ____________________________________________   LABS (all labs ordered are listed, but only abnormal results are displayed)  Labs Reviewed - No data to  display   PROCEDURES  Procedure(s) performed: None  Critical Care performed: No  ____________________________________________   INITIAL IMPRESSION / ASSESSMENT AND PLAN / ED COURSE  Pertinent labs & imaging results that were available during my care of the patient were reviewed by me and considered in my medical decision making (see chart for details).  Patient presents with left shoulder pain. Left shoulder strain. Advise rest and ice therapy. Meloxicam  once daily x 30 days. Flexeril  q8hrs PRN for muscle spasms.  ____________________________________________   FINAL CLINICAL IMPRESSION(S) / ED DIAGNOSES  Final diagnoses:  Shoulder strain, left, initial encounter     Evangeline Dakin, PA-C 04/14/15 2142  Sharyn Creamer, MD 04/15/15 [phone]

## 2015-04-14 NOTE — ED Notes (Signed)
Pt states she woke with left shoulder pain . No fall no injury . States she "thinks she sleep on it wrong "

## 2015-04-14 NOTE — ED Notes (Signed)
Patient c/o left shoulder pain since last PM, denies any known injury, numbness or tingling. Patient able to move all finger. Good pulses present.

## 2015-04-14 NOTE — Discharge Instructions (Signed)
Cryotherapy °Cryotherapy means treatment with cold. Ice or gel packs can be used to reduce both pain and swelling. Ice is the most helpful within the first 24 to 48 hours after an injury or flare-up from overusing a muscle or joint. Sprains, strains, spasms, burning pain, shooting pain, and aches can all be eased with ice. Ice can also be used when recovering from surgery. Ice is effective, has very few side effects, and is safe for most people to use. °PRECAUTIONS  °Ice is not a safe treatment option for people with: °· Raynaud phenomenon. This is a condition affecting small blood vessels in the extremities. Exposure to cold may cause your problems to return. °· Cold hypersensitivity. There are many forms of cold hypersensitivity, including: °· Cold urticaria. Red, itchy hives appear on the skin when the tissues begin to warm after being iced. °· Cold erythema. This is a red, itchy rash caused by exposure to cold. °· Cold hemoglobinuria. Red blood cells break down when the tissues begin to warm after being iced. The hemoglobin that carry oxygen are passed into the urine because they cannot combine with blood proteins fast enough. °· Numbness or altered sensitivity in the area being iced. °If you have any of the following conditions, do not use ice until you have discussed cryotherapy with your caregiver: °· Heart conditions, such as arrhythmia, angina, or chronic heart disease. °· High blood pressure. °· Healing wounds or open skin in the area being iced. °· Current infections. °· Rheumatoid arthritis. °· Poor circulation. °· Diabetes. °Ice slows the blood flow in the region it is applied. This is beneficial when trying to stop inflamed tissues from spreading irritating chemicals to surrounding tissues. However, if you expose your skin to cold temperatures for too long or without the proper protection, you can damage your skin or nerves. Watch for signs of skin damage due to cold. °HOME CARE INSTRUCTIONS °Follow  these tips to use ice and cold packs safely. °· Place a dry or damp towel between the ice and skin. A damp towel will cool the skin more quickly, so you may need to shorten the time that the ice is used. °· For a more rapid response, add gentle compression to the ice. °· Ice for no more than 10 to 20 minutes at a time. The bonier the area you are icing, the less time it will take to get the benefits of ice. °· Check your skin after 5 minutes to make sure there are no signs of a poor response to cold or skin damage. °· Rest 20 minutes or more between uses. °· Once your skin is numb, you can end your treatment. You can test numbness by very lightly touching your skin. The touch should be so light that you do not see the skin dimple from the pressure of your fingertip. When using ice, most people will feel these normal sensations in this order: cold, burning, aching, and numbness. °· Do not use ice on someone who cannot communicate their responses to pain, such as small children or people with dementia. °HOW TO MAKE AN ICE PACK °Ice packs are the most common way to use ice therapy. Other methods include ice massage, ice baths, and cryosprays. Muscle creams that cause a cold, tingly feeling do not offer the same benefits that ice offers and should not be used as a substitute unless recommended by your caregiver. °To make an ice pack, do one of the following: °· Place crushed ice or a   bag of frozen vegetables in a sealable plastic bag. Squeeze out the excess air. Place this bag inside another plastic bag. Slide the bag into a pillowcase or place a damp towel between your skin and the bag. °· Mix 3 parts water with 1 part rubbing alcohol. Freeze the mixture in a sealable plastic bag. When you remove the mixture from the freezer, it will be slushy. Squeeze out the excess air. Place this bag inside another plastic bag. Slide the bag into a pillowcase or place a damp towel between your skin and the bag. °SEEK MEDICAL CARE  IF: °· You develop white spots on your skin. This may give the skin a blotchy (mottled) appearance. °· Your skin turns blue or pale. °· Your skin becomes waxy or hard. °· Your swelling gets worse. °MAKE SURE YOU:  °· Understand these instructions. °· Will watch your condition. °· Will get help right away if you are not doing well or get worse. °  °This information is not intended to replace advice given to you by your health care provider. Make sure you discuss any questions you have with your health care provider. °  °Document Released: 08/19/2010 Document Revised: 01/13/2014 Document Reviewed: 08/19/2010 °Elsevier Interactive Patient Education ©2016 [employer] ° °Muscle Strain °A muscle strain is an injury that occurs when a muscle is stretched beyond its normal length. Usually a small number of muscle fibers are torn when this happens. Muscle strain is rated in degrees. First-degree strains have the least amount of muscle fiber tearing and pain. Second-degree and third-degree strains have increasingly more tearing and pain.  °Usually, recovery from muscle strain takes 1-2 weeks. Complete healing takes 5-6 weeks.  °CAUSES  °Muscle strain happens when a sudden, violent force placed on a muscle stretches it too far. This may occur with lifting, sports, or a fall.  °RISK FACTORS °Muscle strain is especially common in athletes.  °SIGNS AND SYMPTOMS °At the site of the muscle strain, there may be: °· Pain. °· Bruising. °· Swelling. °· Difficulty using the muscle due to pain or lack of normal function. °DIAGNOSIS  °Your health care provider will perform a physical exam and ask about your medical history. °TREATMENT  °Often, the best treatment for a muscle strain is resting, icing, and applying cold compresses to the injured area.   °HOME CARE INSTRUCTIONS  °· Use the PRICE method of treatment to promote muscle healing during the first 2-3 days after your injury. The PRICE method involves: °¨ Protecting the muscle  from being injured again. °¨ Restricting your activity and resting the injured body part. °¨ Icing your injury. To do this, put ice in a plastic bag. Place a towel between your skin and the bag. Then, apply the ice and leave it on from 15-20 minutes each hour. After the third day, switch to moist heat packs. °¨ Apply compression to the injured area with a splint or elastic bandage. Be careful not to wrap it too tightly. This may interfere with blood circulation or increase swelling. °¨ Elevate the injured body part above the level of your heart as often as you can. °· Only take over-the-counter or prescription medicines for pain, discomfort, or fever as directed by your health care provider. °· Warming up prior to exercise helps to prevent future muscle strains. °SEEK MEDICAL CARE IF:  °· You have increasing pain or swelling in the injured area. °· You have numbness, tingling, or a significant loss of strength in the injured area. °MAKE SURE   YOU:  °· Understand these instructions. °· Will watch your condition. °· Will get help right away if you are not doing well or get worse. °  °This information is not intended to replace advice given to you by your health care provider. Make sure you discuss any questions you have with your health care provider. °  °Document Released: 12/23/2004 Document Revised: 10/13/2012 Document Reviewed: 07/22/2012 °Elsevier Interactive Patient Education ©2016 [employer] ° °

## 2015-05-24 ENCOUNTER — Encounter: Payer: Self-pay | Admitting: Emergency Medicine

## 2015-05-24 DIAGNOSIS — J209 Acute bronchitis, unspecified: Secondary | ICD-10-CM | POA: Insufficient documentation

## 2015-05-24 DIAGNOSIS — F17200 Nicotine dependence, unspecified, uncomplicated: Secondary | ICD-10-CM | POA: Diagnosis not present

## 2015-05-24 DIAGNOSIS — R0981 Nasal congestion: Secondary | ICD-10-CM | POA: Diagnosis present

## 2015-05-24 NOTE — ED Notes (Signed)
Pt presents to ED with productive cough and nasal congestion since this morning. Unsure of fever. No increased work of breathing noted at this time.

## 2015-05-25 ENCOUNTER — Emergency Department: Payer: Medicaid Other

## 2015-05-25 ENCOUNTER — Emergency Department
Admission: EM | Admit: 2015-05-25 | Discharge: 2015-05-25 | Disposition: A | Discharge status: Home / Self Care | Payer: Medicaid Other | Attending: Emergency Medicine | Admitting: Emergency Medicine

## 2015-05-25 DIAGNOSIS — J209 Acute bronchitis, unspecified: Secondary | ICD-10-CM

## 2015-05-25 MED ORDER — HYDROCOD POLST-CPM POLST ER 10-8 MG/5ML PO SUER: 5.0000 mL | Freq: Two times a day (BID) | ORAL | Status: DC | PRN

## 2015-05-25 MED ORDER — HYDROCOD POLST-CPM POLST ER 10-8 MG/5ML PO SUER
5.0000 mL | Freq: Once | ORAL | Status: AC
  Administered 2015-05-25: 5 mL via ORAL
  Filled 2015-05-25: qty 5

## 2015-05-25 MED ORDER — AZITHROMYCIN 500 MG PO TABS: 500.0000 mg | ORAL_TABLET | Freq: Every day | ORAL | Status: AC

## 2015-05-25 MED ORDER — AZITHROMYCIN 500 MG PO TABS
500.0000 mg | ORAL_TABLET | Freq: Once | ORAL | Status: AC
Start: 2015-05-25 — End: 2015-05-25
  Administered 2015-05-25: 500 mg via ORAL
  Filled 2015-05-25: qty 1

## 2015-05-25 NOTE — ED Provider Notes (Signed)
[facility] Emergency Department Provider Note  ____________________________________________  Time seen: 4:00 AM  I have reviewed the triage vital signs and the nursing notes.   HISTORY  Chief Complaint Nasal Congestion and Cough      HPI Jilliann Subramanian is a 30 y.o. female presents to one day history of productive cough congestion and subjective fevers. Patient admits to half a pack cigarette daily use     Past medical history None There are no active problems to display for this patient.  Past surgical history None  Current Outpatient Rx  Name  Route  Sig  Dispense  Refill  . azithromycin (ZITHROMAX) 500 MG tablet   Oral   Take 1 tablet (500 mg total) by mouth daily.   3 tablet   0   . chlorpheniramine-HYDROcodone (TUSSIONEX) 10-8 MG/5ML SUER   Oral   Take 5 mLs by mouth every 12 (twelve) hours as needed for cough.   140 mL   0   . cyclobenzaprine (FLEXERIL) 5 MG tablet   Oral   Take 1 tablet (5 mg total) by mouth every 8 (eight) hours as needed for muscle spasms.   30 tablet   0   . dicyclomine (BENTYL) 20 MG tablet   Oral   Take 1 tablet (20 mg total) by mouth 3 (three) times daily as needed for spasms.   15 tablet   0   . ibuprofen (ADVIL,MOTRIN) 600 MG tablet   Oral   Take 1 tablet (600 mg total) by mouth every 6 (six) hours as needed for moderate pain.   15 tablet   0   . meloxicam (MOBIC) 15 MG tablet   Oral   Take 1 tablet (15 mg total) by mouth daily.   30 tablet   0   . metoCLOPramide (REGLAN) 10 MG tablet   Oral   Take 1 tablet (10 mg total) by mouth every 6 (six) hours as needed for nausea or vomiting.   12 tablet   0   . nitrofurantoin, macrocrystal-monohydrate, (MACROBID) 100 MG capsule   Oral   Take 1 capsule (100 mg total) by mouth 2 (two) times daily.   10 capsule   0     Allergies Review of patient's allergies indicates no known allergies.  No family history on file.  Social  History Social History  Substance Use Topics  . Smoking status: Current Every Day Smoker  . Smokeless tobacco: Never Used  . Alcohol Use: No    Review of Systems  Constitutional: Positive for fever. Eyes: Negative for visual changes. ENT: Negative for sore throat. Cardiovascular: Negative for chest pain. Respiratory: Positive for wheezing, positive dyspnea Gastrointestinal: Negative for abdominal pain, vomiting and diarrhea. Genitourinary: Negative for dysuria. Musculoskeletal: Negative for back pain. Skin: Negative for rash. Neurological: Negative for headaches, focal weakness or numbness.   10-point ROS otherwise negative.  ____________________________________________   PHYSICAL EXAM:  VITAL SIGNS: ED Triage Vitals  Enc Vitals Group     BP 05/24/15 2332 99/61 mmHg     Pulse Rate 05/24/15 2332 93     Resp 05/24/15 2332 20     Temp 05/24/15 2332 98.9 F (37.2 C)     Temp Source 05/24/15 2332 Oral     SpO2 05/24/15 2332 95 %     Weight 05/24/15 2323 145 lb (65.772 kg)     Height 05/24/15 2323  (1.676 m)     Head Cir --  Peak Flow --      Pain Score 05/24/15 2323 8     Pain Loc --      Pain Edu? --      Excl. in GC? --      Constitutional: Alert and oriented. Well appearing and in no distress. Eyes: Conjunctivae are normal. PERRL. Normal extraocular movements. ENT   Head: Normocephalic and atraumatic.   Nose: No congestion/rhinnorhea.   Mouth/Throat: Mucous membranes are moist.   Neck: No stridor. Hematological/Lymphatic/Immunilogical: No cervical lymphadenopathy. Cardiovascular: Normal rate, regular rhythm. Normal and symmetric distal pulses are present in all extremities. No murmurs, rubs, or gallops. Respiratory: Normal respiratory effort without tachypnea nor retractions. Breath sounds are clear and equal bilaterally.  Gastrointestinal: Soft and nontender. No distention. There is no CVA tenderness. Genitourinary:  deferred Musculoskeletal: Nontender with normal range of motion in all extremities. No joint effusions.  No lower extremity tenderness nor edema. Neurologic:  Normal speech and language. No gross focal neurologic deficits are appreciated. Speech is normal.  Skin:  Skin is warm, dry and intact. No rash noted. Psychiatric: Mood and affect are normal. Speech and behavior are normal. Patient exhibits appropriate insight and judgment.    EKG  ED ECG REPORT I, Cacao N BROWN, the attending physician, personally viewed and interpreted this ECG.   Date: 05/25/2015  EKG Time: 3:34  Rate: 84  Rhythm: Normal sinus rhythm  Axis: Normal  Intervals: Normal  ST&T Change: None   ____________________________________________    RADIOLOGY     [name] (Final result) Result time: 05/25/15 02:54:23   Final result by Rad Results In Interface (05/25/15 02:54:23)   Narrative:   CLINICAL DATA: 30 year old female with cough and fever  EXAM: CHEST 2 VIEW  COMPARISON: Chest radiograph dated 09/23/2012  FINDINGS: The heart size and mediastinal contours are within normal limits. Both lungs are clear. The visualized skeletal structures are unremarkable.  IMPRESSION: No active cardiopulmonary disease.   Electronically Signed By: Elgie CollardArash Radparvar M.D. On: 05/25/2015 02:54       INITIAL IMPRESSION / ASSESSMENT AND PLAN / ED COURSE  Pertinent labs & imaging results that were available during my care of the patient were reviewed by me and considered in my medical decision making (see chart for details).  Patient received azithromycin Tussinex in the emergency department will be prescribed same for home  ____________________________________________   FINAL CLINICAL IMPRESSION(S) / ED DIAGNOSES  Final diagnoses:  Acute bronchitis, unspecified organism      Darci Currentandolph N Brown, MD 05/25/15 [phone]

## 2015-05-25 NOTE — Discharge Instructions (Signed)

## 2015-05-25 NOTE — ED Notes (Signed)
Patient discharged to home per MD order. Patient in stable condition, and deemed medically cleared by ED provider for discharge. Discharge instructions reviewed with patient/family using "Teach Back"; verbalized understanding of medication education and administration, and information about follow-up care. Denies further concerns. ° °

## 2016-02-06 ENCOUNTER — Encounter: Payer: Self-pay | Admitting: *Deleted

## 2016-02-06 ENCOUNTER — Ambulatory Visit: Payer: Medicaid Other | Attending: Oncology | Admitting: *Deleted

## 2016-02-06 VITALS — BP 102/62 | HR 74 | Temp 97.2°F | Ht 66.14 in | Wt 151.3 lb

## 2016-02-06 DIAGNOSIS — R87612 Low grade squamous intraepithelial lesion on cytologic smear of cervix (LGSIL): Secondary | ICD-10-CM

## 2016-02-06 NOTE — Patient Instructions (Signed)
  HPV Test The human papillomavirus (HPV) test is used to look for high-risk types of HPV infection. HPV is a group of about 100 viruses. Many of these viruses cause growths on, in, or around the genitals. Most HPV viruses cause infections that usually go away without treatment. However, HPV types 6, 11, 16, and 18 are considered high-risk types of HPV that can increase your risk of cancer of the cervix or anus if the infection is left untreated. An HPV test identifies the DNA (genetic) strands of the HPV infection, so it is also referred to as the HPV DNA test. Although HPV is found in both males and females, the HPV test is only used to screen for increased cancer risk in females:  With an abnormal Pap test.  After treatment of an abnormal Pap test.  Between the ages of 30 and 65.  After treatment of a high-risk HPV infection.  The HPV test may be done at the same time as a pelvic exam and Pap test in females over the age of 30. Both the HPV test and Pap test require a sample of cells from the cervix. How do I prepare for this test?  Do not douche or take a bath for 24-48 hours before the test or as directed by your health care provider.  Do not have sex for 24-48 hours before the test or as directed by your health care provider.  You may be asked to reschedule the test if you are menstruating.  You will be asked to urinate before the test. What do the results mean? It is your responsibility to obtain your test results. Ask the lab or department performing the test when and how you will get your results. Talk with your health care provider if you have any questions about your results. Your result will be negative or positive. Meaning of Negative Test Results A negative HPV test result means that no HPV was found, and it is very likely that you do not have HPV. Meaning of Positive Test Results A positive HPV test result indicates that you have HPV.  If your test result shows the  presence of any high-risk HPV strains, you may have an increased risk of developing cancer of the cervix or anus if the infection is left untreated.  If any low-risk HPV strains are found, you are not likely to have an increased risk of cancer.  Discuss your test results with your health care provider. He or she will use the results to make a diagnosis and determine a treatment plan that is right for you. Talk with your health care provider to discuss your results, treatment options, and if necessary, the need for more tests. Talk with your health care provider if you have any questions about your results. This information is not intended to replace advice given to you by your health care provider. Make sure you discuss any questions you have with your health care provider. Document Released: 01/18/2004 Document Revised: 08/29/2015 Document Reviewed: 05/10/2013 Elsevier Interactive Patient Education  2017 [employer] Inc[employer]  

## 2016-02-06 NOTE — Progress Notes (Signed)
Subjective:     Patient ID: Desiree Mosley, female   DOB: [birth year], 31 y.o.   MRN: [id number]  HPI   Review of Systems     Objective:   Physical Exam     Assessment:     31 year [facility] White [facility] for further evaluation of recent abnormal pap of LGSIL.  Patient and her friend present today to review finding and set up GYN consult.  Reviewed abnormal findings of LGSIL with patient.  There was no HPV testing completed at the time of her pap.   Hand-out given on understanding abnormal pap smears, and a hand-out on HPV.    Plan:     Joellyn QuailsChristy Burton to schedule patient a GYN consult with [facility].  Will follow-up per [facility] protocol.

## 2016-02-19 ENCOUNTER — Ambulatory Visit (INDEPENDENT_AMBULATORY_CARE_PROVIDER_SITE_OTHER): Payer: Self-pay | Admitting: Obstetrics and Gynecology

## 2016-02-19 ENCOUNTER — Encounter: Payer: Self-pay | Admitting: Obstetrics and Gynecology

## 2016-02-19 VITALS — BP 98/61 | HR 90 | Ht 66.0 in | Wt 155.2 lb

## 2016-02-19 DIAGNOSIS — R87612 Low grade squamous intraepithelial lesion on cytologic smear of cervix (LGSIL): Secondary | ICD-10-CM

## 2016-02-19 NOTE — Addendum Note (Signed)
Addended by: Brooke DareSICK, Tanayia Wahlquist L on: 02/19/2016 03:28 PM   Modules accepted: Orders

## 2016-02-19 NOTE — Addendum Note (Signed)
Addended by: Marchelle FolksMILLER, Pawel Soules G on: 02/19/2016 03:26 PM   Modules accepted: Orders

## 2016-02-19 NOTE — Progress Notes (Signed)
Referring Provider:  [facility]  HPI:  Desiree Mosley is a 31 y.o.  G4P3000  who presents today for evaluation and management of abnormal cervical cytology.  This is her first abnormal Pap smear. She recently had her IUD removed and is attempting pregnancy at this time.  Dysplasia History:  LGSIL - unknown HPV  ROS:  Pertinent items are noted in HPI.  OB History  Gravida Para Term Preterm AB Living  4 3 3         SAB TAB Ectopic Multiple Live Births               # Outcome Date GA Lbr Len/2nd Weight Sex Delivery Anes PTL Lv  4 Gravida           3 Term           2 Term           1 Term               History reviewed. No pertinent past medical history.  History reviewed. No pertinent surgical history.  SOCIAL HISTORY: History  Alcohol Use No   History  Drug Use No     Family History  Problem Relation Age of Onset  . Cancer Mother   . Thyroid disease Mother   . Cancer Father   . Cancer Maternal Grandmother     ALLERGIES:  Patient has no known allergies.  She currently has no medications in their medication list.  Physical Exam: -Vitals:  BP 98/61   Pulse 90   Ht 5\' 6"  (1.676 m)   Wt 155 lb 3 oz (70.4 kg)   BMI 25.05 kg/m  GEN: WD, WN, NAD.  A+ O x 3, good mood and affect. ABD:  NT, ND.  Soft, no masses.  No hernias noted.   Pelvic:   Vulva: Normal appearance.  No lesions.  Vagina: No lesions or abnormalities noted.  Support: Normal pelvic support.  Urethra No masses tenderness or scarring.  Meatus Normal size without lesions or prolapse.  Cervix: See below.  Anus: Normal exam.  No lesions.  Perineum: Normal exam.  No lesions.        Bimanual   Uterus: Normal size.  Non-tender.  Mobile.  AV.  Adnexae: No masses.  Non-tender to palpation.  Cul-de-sac: Negative for abnormality.   PROCEDURE: 1.  Urine Pregnancy Test:  negative 2.  Colposcopy performed with 4% acetic acid after verbal consent obtained                            -Aceto-white Lesions  Location(s): 6 and 12 o'clock.              -Biopsy performed at 6 and 12 o'clock               -ECC indicated and performed: No.     -Biopsy sites made hemostatic with pressure and Monsel's solution   -Satisfactory colposcopy: Yes.      -Evidence of Invasive cervical CA :  NO  ASSESSMENT:  Desiree Mosley is a 31 y.o. G4P3000 here for  1. LGSIL on Pap smear of cervix     PLAN: 1.  I discussed the grading system of pap smears and HPV high risk viral types.  We will discuss management after colpo results return.         F/U  Return in about 10 days (around 02/29/2016).  Brennan Baileyavid Deshanta Lady ,MD  02/19/2016,2:49 PM

## 2016-02-25 LAB — PATHOLOGY

## 2016-02-28 ENCOUNTER — Encounter: Payer: Self-pay | Admitting: Obstetrics and Gynecology

## 2016-02-28 ENCOUNTER — Ambulatory Visit (INDEPENDENT_AMBULATORY_CARE_PROVIDER_SITE_OTHER): Payer: Self-pay | Admitting: Obstetrics and Gynecology

## 2016-02-28 VITALS — BP 104/65 | HR 66 | Ht 66.0 in | Wt 154.5 lb

## 2016-02-28 DIAGNOSIS — R87612 Low grade squamous intraepithelial lesion on cytologic smear of cervix (LGSIL): Secondary | ICD-10-CM

## 2016-02-28 LAB — HPV GENOTYPES 16/18,45
HPV Genotype 16: NEGATIVE
HPV Genotype 18,45: NEGATIVE

## 2016-02-28 NOTE — Progress Notes (Signed)
HPI:      [name] is a 31 y.o. G4P3000 who LMP was Patient's last menstrual period was 02/25/2016 (approximate).  Subjective:   She presents today For follow-up of her colposcopy and HPV testing.    Hx: The following portions of the patient's history were reviewed and updated as appropriate:        Her family history includes Cancer in her father, maternal grandmother, and mother; Thyroid disease in her mother. She  reports that she has been smoking.  She has never used smokeless tobacco. She reports that she does not drink alcohol or use drugs.       Review of Systems:  Review of Systems  Constitutional: Denied constitutional symptoms, night sweats, recent illness, fatigue, fever, insomnia and weight loss.  Eyes: Denied eye symptoms, eye pain, photophobia, vision change and visual disturbance.  Ears/Nose/Throat/Neck: Denied ear, nose, throat or neck symptoms, hearing loss, nasal discharge, sinus congestion and sore throat.  Cardiovascular: Denied cardiovascular symptoms, arrhythmia, chest pain/pressure, edema, exercise intolerance, orthopnea and palpitations.  Respiratory: Denied pulmonary symptoms, asthma, pleuritic pain, productive sputum, cough, dyspnea and wheezing.  Gastrointestinal: Denied, gastro-esophageal reflux, melena, nausea and vomiting.  Genitourinary: Denied genitourinary symptoms including symptomatic vaginal discharge, pelvic relaxation issues, and urinary complaints.  Musculoskeletal: Denied musculoskeletal symptoms, stiffness, swelling, muscle weakness and myalgia.  Dermatologic: Denied dermatology symptoms, rash and scar.  Neurologic: Denied neurology symptoms, dizziness, headache, neck pain and syncope.  Psychiatric: Denied psychiatric symptoms, anxiety and depression.  Endocrine: Denied endocrine symptoms including hot flashes and night sweats.   Meds:   No current outpatient prescriptions on file prior to visit.   No current facility-administered  medications on file prior to visit.     Objective:     Vitals:   02/28/16 0939  BP: 104/65  Pulse: 66              Pathology results reviewed directly with the patient.  Assessment:    G4P3000 There are no active problems to display for this patient.    1. LGSIL on Pap smear of cervix     Normal colposcopy cytology with negative HPV genotype for 16, 18 and 45.   Plan:            1.  We have discussed abnormal Pap smears and colposcopies in detail. I have recommended annual Pap smears. Should LGSIL return consider re-colposcopy.       F/U  Return for Annual Physical. I spent 16 minutes with this patient of which greater than 50% was spent discussing abnormal cervical cytology, colposcopy, HPV, follow-up for abnormal Pap smears.  Desiree Mosley, M.D. 02/28/2016 10:58 AM

## 2016-03-05 ENCOUNTER — Encounter: Payer: Self-pay | Admitting: *Deleted

## 2016-03-05 NOTE — Progress Notes (Signed)
Benign cervical biopsy with Dr. Logan BoresEvans at [facility].  He recommends follow up pap in one year.  [facility] [place].

## 2016-04-29 ENCOUNTER — Emergency Department: Payer: Medicaid Other

## 2016-04-29 ENCOUNTER — Encounter: Payer: Self-pay | Admitting: Emergency Medicine

## 2016-04-29 ENCOUNTER — Emergency Department
Admission: EM | Admit: 2016-04-29 | Discharge: 2016-04-29 | Disposition: A | Discharge status: Home / Self Care | Payer: Medicaid Other | Attending: Emergency Medicine | Admitting: Emergency Medicine

## 2016-04-29 DIAGNOSIS — N898 Other specified noninflammatory disorders of vagina: Secondary | ICD-10-CM | POA: Insufficient documentation

## 2016-04-29 DIAGNOSIS — O209 Hemorrhage in early pregnancy, unspecified: Secondary | ICD-10-CM | POA: Diagnosis present

## 2016-04-29 DIAGNOSIS — F17210 Nicotine dependence, cigarettes, uncomplicated: Secondary | ICD-10-CM | POA: Insufficient documentation

## 2016-04-29 DIAGNOSIS — O99331 Smoking (tobacco) complicating pregnancy, first trimester: Secondary | ICD-10-CM | POA: Diagnosis not present

## 2016-04-29 DIAGNOSIS — O2311 Infections of bladder in pregnancy, first trimester: Secondary | ICD-10-CM | POA: Diagnosis not present

## 2016-04-29 DIAGNOSIS — Z3A01 Less than 8 weeks gestation of pregnancy: Secondary | ICD-10-CM | POA: Insufficient documentation

## 2016-04-29 DIAGNOSIS — O4690 Antepartum hemorrhage, unspecified, unspecified trimester: Secondary | ICD-10-CM

## 2016-04-29 DIAGNOSIS — N3000 Acute cystitis without hematuria: Secondary | ICD-10-CM

## 2016-04-29 LAB — URINALYSIS, COMPLETE (UACMP) WITH MICROSCOPIC
Bilirubin Urine: NEGATIVE
Glucose, UA: NEGATIVE mg/dL
Ketones, ur: NEGATIVE mg/dL
Nitrite: POSITIVE — AB
Protein, ur: NEGATIVE mg/dL
Specific Gravity, Urine: 1.018 (ref 1.005–1.030)
pH: 7 (ref 5.0–8.0)

## 2016-04-29 LAB — BASIC METABOLIC PANEL
Anion gap: 7 (ref 5–15)
BUN: 8 mg/dL (ref 6–20)
CO2: 26 mmol/L (ref 22–32)
Calcium: 9.2 mg/dL (ref 8.9–10.3)
Chloride: 104 mmol/L (ref 101–111)
Creatinine, Ser: 0.71 mg/dL (ref 0.44–1.00)
GFR calc Af Amer: >60 mL/min (ref >60)
GFR calc non Af Amer: >60 mL/min (ref >60)
Glucose, Bld: 72 mg/dL (ref 65–99)
Potassium: 3.6 mmol/L (ref 3.5–5.1)
Sodium: 137 mmol/L (ref 135–145)

## 2016-04-29 LAB — CBC
HCT: 40.5 % (ref 35.0–47.0)
Hemoglobin: 13.8 g/dL (ref 12.0–16.0)
MCH: 32.4 pg (ref 26.0–34.0)
MCHC: 34 g/dL (ref 32.0–36.0)
MCV: 95.1 fL (ref 80.0–100.0)
Platelets: 331 10*3/uL (ref 150–440)
RBC: 4.25 MIL/uL (ref 3.80–5.20)
RDW: 14.2 % (ref 11.5–14.5)
WBC: 8.9 10*3/uL (ref 3.6–11.0)

## 2016-04-29 LAB — WET PREP, GENITAL
Clue Cells Wet Prep HPF POC: NONE SEEN
Sperm: NONE SEEN
Trich, Wet Prep: NONE SEEN
Yeast Wet Prep HPF POC: NONE SEEN

## 2016-04-29 LAB — ABO/RH: ABO/RH(D): O POS

## 2016-04-29 LAB — CHLAMYDIA/NGC RT PCR (ARMC ONLY)
Chlamydia Tr: NOT DETECTED
N gonorrhoeae: NOT DETECTED

## 2016-04-29 LAB — HCG, QUANTITATIVE, PREGNANCY: hCG, Beta Chain, Quant, S: 510 m[IU]/mL — ABNORMAL HIGH (ref <5)

## 2016-04-29 LAB — POCT PREGNANCY, URINE: Preg Test, Ur: POSITIVE — AB

## 2016-04-29 MED ORDER — NITROFURANTOIN MONOHYD MACRO 100 MG PO CAPS: 100.0000 mg | ORAL_CAPSULE | Freq: Two times a day (BID) | ORAL | 0 refills | Status: AC

## 2016-04-29 MED ORDER — NITROFURANTOIN MONOHYD MACRO 100 MG PO CAPS
ORAL_CAPSULE | ORAL | Status: AC
  Filled 2016-04-29: qty 1

## 2016-04-29 MED ORDER — NITROFURANTOIN MONOHYD MACRO 100 MG PO CAPS
100.0000 mg | ORAL_CAPSULE | Freq: Once | ORAL | Status: AC
  Administered 2016-04-29: 100 mg via ORAL

## 2016-04-29 NOTE — ED Notes (Signed)
Pt reports that she is pregnant (last period in Feb) and this am she started with vaginal bleeding (bright red) - pt denies abd pain

## 2016-04-29 NOTE — ED Provider Notes (Signed)
[facility] Emergency Department Provider Note  ____________________________________________  Time seen: Approximately 5:56 PM  I have reviewed the triage vital signs and the nursing notes.   HISTORY  Chief Complaint Vaginal Bleeding    HPI Desiree Mosley is a 31 y.o. female G5 P3 A1 with LMP approximately 9 weeks ago presenting for vaginal bleeding. The patient reports that this morning she had some mild bleeding, but did notice that it increased when she was taking a shower. She has not had any abdominal pain, change in vaginal discharge, fever or chills. No lightheadedness or syncope. The patient has not yet established OB/GYN care.The patient has never been treated for gonorrhea or chlamydia in the past.   History reviewed. No pertinent past medical history.  There are no active problems to display for this patient.   History reviewed. No pertinent surgical history.    Allergies Patient has no known allergies.  Family History  Problem Relation Age of Onset  . Cancer Mother   . Thyroid disease Mother   . Cancer Father   . Cancer Maternal Grandmother     Social History Social History  Substance Use Topics  . Smoking status: Current Every Day Smoker    Packs/day: 1.00    Types: Cigarettes  . Smokeless tobacco: Never Used  . Alcohol use No    Review of Systems Constitutional: No fever/chills. Eyes: No visual changes. ENT: No sore throat. No congestion or rhinorrhea. Cardiovascular: Denies chest pain. Denies palpitations. Respiratory: Denies shortness of breath.  No cough. Gastrointestinal: No abdominal pain.  No nausea, no vomiting.  No diarrhea.  No constipation. Genitourinary: Negative for dysuria.Positive vaginal bleeding. No clots. Musculoskeletal: Negative for back pain. Skin: Negative for rash. Neurological: Negative for headaches. No focal numbness, tingling or weakness.   10-point ROS otherwise  negative.  ____________________________________________   PHYSICAL EXAM:  VITAL SIGNS: ED Triage Vitals  Enc Vitals Group     BP 04/29/16 1614 (!) 100/58     Pulse Rate 04/29/16 1613 67     Resp 04/29/16 1613 17     Temp 04/29/16 1613 98.4 F (36.9 C)     Temp Source 04/29/16 1613 Oral     SpO2 04/29/16 1613 98 %     Weight 04/29/16 1613 163 lb (73.9 kg)     Height 04/29/16 1613  (1.676 m)     Head Circumference --      Peak Flow --      Pain Score 04/29/16 1743 0     Pain Loc --      Pain Edu? --      Excl. in GC? --     Constitutional: Alert and oriented. Well appearing and in no acute distress. Answers questions appropriately. Eyes: Conjunctivae are normal.  EOMI. No scleral icterus. Head: Atraumatic. Nose: No congestion/rhinnorhea. Mouth/Throat: Mucous membranes are moist.  Neck: No stridor.  Supple.   Cardiovascular: Normal rate, regular rhythm. No murmurs, rubs or gallops.  Respiratory: Normal respiratory effort.  No accessory muscle use or retractions. Lungs CTAB.  No wheezes, rales or ronchi. Gastrointestinal: Soft, nontender and nondistended.  No guarding or rebound.  No peritoneal signs. Genitourinary: Normal-appearing external genitalia without lesions. Vaginal exam with mild to moderate discharge, normal-appearing cervix, normal vaginal wall tissue. Bimanual exam is negative for CMT, adnexal tenderness to palpation, no palpable masses. Cervix not palpable despite multiple manuveurs including pushing down on the fundus; cannot comment on whether os is open or closed. Musculoskeletal: No  LE edema.  Neurologic:  A&Ox3.  Speech is clear.  Face and smile are symmetric.  EOMI.  Moves all extremities well. Skin:  Skin is warm, dry and intact. No rash noted. Psychiatric: Mood and affect are normal. Speech and behavior are normal.  Normal judgement.  ____________________________________________   LABS (all labs ordered are listed, but only abnormal results are  displayed)  Labs Reviewed  URINALYSIS, COMPLETE (UACMP) WITH MICROSCOPIC - Abnormal; Notable for the following:       Result Value   Color, Urine YELLOW (*)    APPearance HAZY (*)    Hgb urine dipstick LARGE (*)    Nitrite POSITIVE (*)    Leukocytes, UA TRACE (*)    Bacteria, UA RARE (*)    Squamous Epithelial / LPF 0-5 (*)    All other components within normal limits  HCG, QUANTITATIVE, PREGNANCY - Abnormal; Notable for the following:    hCG, Beta Chain, Quant, S 510 (*)    All other components within normal limits  POCT PREGNANCY, URINE - Abnormal; Notable for the following:    Preg Test, Ur POSITIVE (*)    All other components within normal limits  CHLAMYDIA/NGC RT PCR ([facility] ONLY)  WET PREP, GENITAL  CBC  BASIC METABOLIC PANEL  ABO/RH   ____________________________________________  EKG  Not indicated ____________________________________________  RADIOLOGY  US Ob Comp Less 14 Wks  Result Date: 04/29/2016 CLINICAL DATA:  Acute onset of vaginal bleeding.  Initial encounter. EXAM: OBSTETRIC <14 WK [place] AND TRANSVAGINAL OB US TECHNIQUE: Both transabdominal and transvaginal ultrasound examinations were performed for complete evaluation of the gestation as well as the maternal uterus, adnexal regions, and pelvic cul-de-sac. Transvaginal technique was performed to assess early pregnancy. COMPARISON:  CT of the abdomen and pelvis performed 02/14/2015, and pelvic ultrasound performed 07/09/2014 FINDINGS: Intrauterine gestational sac: Single; visualized and normal in shape. Yolk sac:  No Embryo:  No MSD: 2.6  mm   5 w   0  d Subchorionic hemorrhage:  None visualized. Maternal uterus/adnexae: Scattered calcifications are noted about the endometrial echo complex. The uterus is otherwise unremarkable. The ovaries are within normal limits. The right ovary measures 3.0 x 2.9 x 2.3 cm, while the left ovary measures 2.3 x 2.1 x 1.9 cm. No suspicious adnexal masses are seen; there is no  evidence for ovarian torsion. No free fluid is seen within the pelvic cul-de-sac. IMPRESSION: Single tiny intrauterine gestational sac noted, with a mean sac diameter of 2.6 mm, corresponding to a gestational age of [redacted] weeks 0 days. This does not match the gestational age by LMP, though it remains too early to determine a new estimated date of delivery. No yolk sac or embryo is yet seen, within normal limits. Would perform follow-up pelvic ultrasound in 2 weeks for further evaluation, if the patient's quantitative beta HCG continues to trend upward. Electronically Signed   By: Roanna Raider M.D.   On: 04/29/2016 17:46   US Ob Transvaginal  Result Date: 04/29/2016 CLINICAL DATA:  Acute onset of vaginal bleeding.  Initial encounter. EXAM: OBSTETRIC <14 WK [place] AND TRANSVAGINAL OB US TECHNIQUE: Both transabdominal and transvaginal ultrasound examinations were performed for complete evaluation of the gestation as well as the maternal uterus, adnexal regions, and pelvic cul-de-sac. Transvaginal technique was performed to assess early pregnancy. COMPARISON:  CT of the abdomen and pelvis performed 02/14/2015, and pelvic ultrasound performed 07/09/2014 FINDINGS: Intrauterine gestational sac: Single; visualized and normal in shape. Yolk sac:  No Embryo:  No  MSD: 2.6  mm   5 w   0  d Subchorionic hemorrhage:  None visualized. Maternal uterus/adnexae: Scattered calcifications are noted about the endometrial echo complex. The uterus is otherwise unremarkable. The ovaries are within normal limits. The right ovary measures 3.0 x 2.9 x 2.3 cm, while the left ovary measures 2.3 x 2.1 x 1.9 cm. No suspicious adnexal masses are seen; there is no evidence for ovarian torsion. No free fluid is seen within the pelvic cul-de-sac. IMPRESSION: Single tiny intrauterine gestational sac noted, with a mean sac diameter of 2.6 mm, corresponding to a gestational age of [redacted] weeks 0 days. This does not match the gestational age by LMP, though it  remains too early to determine a new estimated date of delivery. No yolk sac or embryo is yet seen, within normal limits. Would perform follow-up pelvic ultrasound in 2 weeks for further evaluation, if the patient's quantitative beta HCG continues to trend upward. Electronically Signed   By: Roanna Raider M.D.   On: 04/29/2016 17:46    ____________________________________________   PROCEDURES  Procedure(s) performed: None  Procedures  Critical Care performed: No ____________________________________________   INITIAL IMPRESSION / ASSESSMENT AND PLAN / ED COURSE  Pertinent labs & imaging results that were available during my care of the patient were reviewed by me and considered in my medical decision making (see chart for details).  31 y.o. female with LMP 9 weeks ago and positive pregnancy test here, G5 P3, presenting for vaginal bleeding. Overall, the patient is hemodynamically stable. Her hCG is 510 and there is a gestational sac in her ultrasound without yolk sac or fetus. This may be due to spontaneous AB, or she may be earlier in the pregnancy than her LMP was suggested. Heterotopic ectopic is much less likely. Her UA does appear to be consistent with UTI, and I will treat her for that even though she has not been symptomatic. Her blood counts are otherwise stable. I will speak to the physician on-call for [facility] for close follow-up. The patient is Rh+,Rhogam is not indicated.  ----------------------------------------- 6:16 PM on 04/29/2016 -----------------------------------------  I spoke with Dr. Jean Rosenthal, and the patient will be seen in clinic on Thursday.  ____________________________________________  FINAL CLINICAL IMPRESSION(S) / ED DIAGNOSES  Final diagnoses:  Acute cystitis without hematuria  Vaginal bleeding in pregnancy         NEW MEDICATIONS STARTED DURING THIS VISIT:  New Prescriptions   NITROFURANTOIN, MACROCRYSTAL-MONOHYDRATE, (MACROBID) 100  MG CAPSULE    Take 1 capsule (100 mg total) by mouth 2 (two) times daily.      Rockne Menghini, MD 04/29/16 [phone]

## 2016-04-29 NOTE — ED Triage Notes (Signed)
Patient presents to ED via POV with c/o vaginal bleeding that began this morning. G5P3. Patient states it was heavy bleeding and that she was passing clots. Patient denies abdominal pain.

## 2016-04-29 NOTE — Discharge Instructions (Signed)
Please take the entire course of antibiotics for urinary tract infection, even if he do not have any symptoms.  Please follow-up with the OB/GYN on Thursday.  Return to the emergency department if you develop severe pain, lightheadedness or fainting, if you're soaking through more than 1 pad every two hours, fever, or any other symptoms concerning to you.

## 2016-06-23 ENCOUNTER — Emergency Department
Admission: EM | Admit: 2016-06-23 | Discharge: 2016-06-23 | Disposition: A | Discharge status: Home / Self Care | Payer: Medicaid Other | Attending: Emergency Medicine | Admitting: Emergency Medicine

## 2016-06-23 ENCOUNTER — Encounter: Payer: Self-pay | Admitting: Emergency Medicine

## 2016-06-23 ENCOUNTER — Emergency Department: Payer: Medicaid Other

## 2016-06-23 DIAGNOSIS — F17210 Nicotine dependence, cigarettes, uncomplicated: Secondary | ICD-10-CM | POA: Diagnosis not present

## 2016-06-23 DIAGNOSIS — R05 Cough: Secondary | ICD-10-CM | POA: Insufficient documentation

## 2016-06-23 DIAGNOSIS — J029 Acute pharyngitis, unspecified: Secondary | ICD-10-CM | POA: Diagnosis not present

## 2016-06-23 LAB — POCT RAPID STREP A: Streptococcus, Group A Screen (Direct): NEGATIVE

## 2016-06-23 LAB — POCT PREGNANCY, URINE: Preg Test, Ur: NEGATIVE

## 2016-06-23 MED ORDER — PSEUDOEPH-BROMPHEN-DM 30-2-10 MG/5ML PO SYRP: 10.0000 mL | ORAL_SOLUTION | Freq: Four times a day (QID) | ORAL | 0 refills | Status: DC | PRN

## 2016-06-23 MED ORDER — MAGIC MOUTHWASH W/LIDOCAINE: 5.0000 mL | Freq: Four times a day (QID) | ORAL | 0 refills | Status: DC

## 2016-06-23 NOTE — ED Provider Notes (Signed)
Desiree Mosley: Approximately 4:22 PM  I have reviewed the triage vital signs and the nursing notes.   HISTORY  Chief Complaint Sore Throat and Chest Pain    HPI Desiree Mosley is a 31 y.o. female who presents emergency department for complaint of one-day history of sore throat, cough, chest discomfort. Patient reports that she has had some nasal congestion, sore throat, coughing, audible wheezing. No fevers or chills. All symptoms began today. No medications prior to arrival. No recent sick contacts. No other complaints at this time.   History reviewed. No pertinent past medical history.  There are no active problems to display for this patient.   History reviewed. No pertinent surgical history.  Prior to Admission medications   Medication Sig Start Date End Date Taking? Authorizing Provider  brompheniramine-pseudoephedrine-DM 30-2-10 MG/5ML syrup Take 10 mLs by mouth 4 (four) times daily as needed. 06/23/16   Desiree Mosley, Delorise RoyalsJonathan D, PA-C  magic mouthwash w/lidocaine SOLN Take 5 mLs by mouth 4 (four) times daily. 06/23/16   Desiree Mosley, Delorise RoyalsJonathan D, PA-C    Allergies Patient has no known allergies.  Family History  Problem Relation Age of Onset  . Cancer Mother   . Thyroid disease Mother   . Cancer Father   . Cancer Maternal Grandmother     Social History Social History  Substance Use Topics  . Smoking status: Current Every Day Smoker    Packs/day: 1.00    Types: Cigarettes  . Smokeless tobacco: Never Used  . Alcohol use No     Review of Systems  Constitutional: No fever/chills Eyes: No visual changes. No discharge ENT: Positive for nasal congestion and sore throat. Cardiovascular: no chest pain. Positive for chest discomfort with coughing. Respiratory: Positive cough. No SOB. Gastrointestinal: No abdominal pain.  No nausea, no vomiting.    Musculoskeletal: Negative for musculoskeletal pain. Skin: Negative for rash, abrasions, lacerations, ecchymosis. Neurological: Negative for headaches, focal weakness or numbness. 10-point ROS otherwise negative.  ____________________________________________   PHYSICAL EXAM:  VITAL SIGNS: ED Triage Vitals  Enc Vitals Group     BP 06/23/16 1526 110/60     Pulse Rate 06/23/16 1526 85     Resp --      Temp 06/23/16 1526 98.2 F (36.8 C)     Temp Source 06/23/16 1526 Oral     SpO2 06/23/16 1526 97 %     Weight 06/23/16 1526 155 lb (70.3 kg)     Height 06/23/16 1526 5\' 6"  (1.676 m)     Head Circumference --      Peak Flow --      Pain Score 06/23/16 1530 6     Pain Loc --      Pain Edu? --      Excl. in GC? --      Constitutional: Alert and oriented. Well appearing and in no acute distress. Eyes: Conjunctivae are normal. PERRL. EOMI. Head: Atraumatic. ENT:      Ears: EACs and TMs unremarkable bilaterally.      Nose: Mild congestion/rhinnorhea.      Mouth/Throat: Mucous membranes are moist. Oropharynx is moderately erythematous but nonedematous. Tonsils are mildly erythematous and edematous but no exudates. Uvula is midline. Neck: No stridor. Neck is supple full range of motion Hematological/Lymphatic/Immunilogical: No cervical lymphadenopathy. Cardiovascular: Normal rate, regular rhythm. Normal S1 and S2.  Good peripheral circulation. Respiratory: Normal respiratory effort without tachypnea or retractions. Lungs CTAB. Good air entry to  the bases with no decreased or absent breath sounds. Musculoskeletal: Full range of motion to all extremities. No gross deformities appreciated. Neurologic:  Normal speech and language. No gross focal neurologic deficits are appreciated.  Skin:  Skin is warm, dry and intact. No rash noted. Psychiatric: Mood and affect are normal. Speech and behavior are normal. Patient exhibits appropriate insight and  judgement.   ____________________________________________   LABS (all labs ordered are listed, but only abnormal results are displayed)  Labs Reviewed  CULTURE, GROUP A STREP ([facility])  POC URINE PREG, ED  POCT RAPID STREP A  POCT PREGNANCY, URINE   ____________________________________________  EKG  EKG reveals normal sinus rhythm at a rate of 81 bpm. PR, QRS, QT interval within normal limits. No ST elevation or depression noted. Normal axis. Normal EKG. ____________________________________________  RADIOLOGY Festus Barren Latoyna Hird, personally viewed and evaluated these images (plain radiographs) as part of my medical decision making, as well as reviewing the written report by the radiologist.  [name]  Result Date: 06/23/2016 CLINICAL DATA:  The cough and chest pain. EXAM: CHEST  2 VIEW COMPARISON:  Two-view chest x-ray 05/25/2015. FINDINGS: The heart size and mediastinal contours are within normal limits. Both lungs are clear. The visualized skeletal structures are unremarkable. IMPRESSION: Negative two view chest x-ray. Electronically Signed   By: Marin Roberts M.Mosley.   On: 06/23/2016 17:07    ____________________________________________    PROCEDURES  Procedure(s) performed:    Procedures    Medications - No data to display   ____________________________________________   INITIAL IMPRESSION / ASSESSMENT AND PLAN / ED COURSE  Pertinent labs & imaging results that were available during my care of the patient were reviewed by me and considered in my medical decision making (see chart for details).  Review of the [place] CSRS was performed in accordance of the [facility] prior to dispensing any controlled drugs.     Patient's diagnosis is consistent with viral pharyngitis. Negative strep test in the emergency department and negative on the center criteria. Negative chest x-ray and EKG.. Patient will be discharged home with prescriptions for symptom control  medications. Patient is to follow up with primary care as needed or otherwise directed. Patient is given ED precautions to return to the ED for any worsening or new symptoms.     ____________________________________________  FINAL CLINICAL IMPRESSION(S) / ED DIAGNOSES  Final diagnoses:  Viral pharyngitis      NEW MEDICATIONS STARTED DURING THIS VISIT:  Discharge Medication List as of 06/23/2016  5:47 PM    START taking these medications   Details  brompheniramine-pseudoephedrine-DM 30-2-10 MG/5ML syrup Take 10 mLs by mouth 4 (four) times daily as needed., Starting Mon 06/23/2016, Print    magic mouthwash w/lidocaine SOLN Take 5 mLs by mouth 4 (four) times daily., Starting Mon 06/23/2016, Print            This chart was dictated using voice recognition software/Dragon. Despite best efforts to proofread, errors can occur which can change the meaning. Any change was purely unintentional.    Racheal Patches, PA-C 06/23/16 1806    Loleta Rose, MD 06/23/16 Jerene Bears

## 2016-06-23 NOTE — ED Triage Notes (Signed)
Pt presents with sore throat and chest pain when she breathes in, states she is wheezing. No resp distress noted.

## 2016-06-23 NOTE — ED Notes (Signed)
See triage note   States she woke up with sore throat,hoarsness and some chest discomfort   Pain to chest is increased with breathing  No fever

## 2016-06-26 LAB — CULTURE, GROUP A STREP (THRC)

## 2017-02-18 ENCOUNTER — Emergency Department
Admission: EM | Admit: 2017-02-18 | Discharge: 2017-02-19 | Disposition: A | Discharge status: Home / Self Care | Payer: Medicaid Other | Attending: Emergency Medicine | Admitting: Emergency Medicine

## 2017-02-18 DIAGNOSIS — N390 Urinary tract infection, site not specified: Secondary | ICD-10-CM | POA: Insufficient documentation

## 2017-02-18 DIAGNOSIS — A084 Viral intestinal infection, unspecified: Secondary | ICD-10-CM | POA: Diagnosis not present

## 2017-02-18 DIAGNOSIS — R112 Nausea with vomiting, unspecified: Secondary | ICD-10-CM | POA: Diagnosis present

## 2017-02-18 DIAGNOSIS — F17210 Nicotine dependence, cigarettes, uncomplicated: Secondary | ICD-10-CM | POA: Insufficient documentation

## 2017-02-18 LAB — COMPREHENSIVE METABOLIC PANEL
ALT: 12 U/L — ABNORMAL LOW (ref 14–54)
AST: 28 U/L (ref 15–41)
Albumin: 4.5 g/dL (ref 3.5–5.0)
Alkaline Phosphatase: 76 U/L (ref 38–126)
Anion gap: 14 (ref 5–15)
BUN: 20 mg/dL (ref 6–20)
CO2: 21 mmol/L — ABNORMAL LOW (ref 22–32)
Calcium: 9.2 mg/dL (ref 8.9–10.3)
Chloride: 107 mmol/L (ref 101–111)
Creatinine, Ser: 0.81 mg/dL (ref 0.44–1.00)
GFR calc Af Amer: >60 mL/min (ref >60)
GFR calc non Af Amer: >60 mL/min (ref >60)
Glucose, Bld: 126 mg/dL — ABNORMAL HIGH (ref 65–99)
Potassium: 5 mmol/L (ref 3.5–5.1)
Sodium: 142 mmol/L (ref 135–145)
Total Bilirubin: 1.5 mg/dL — ABNORMAL HIGH (ref 0.3–1.2)
Total Protein: 8.3 g/dL — ABNORMAL HIGH (ref 6.5–8.1)

## 2017-02-18 LAB — CBC
HCT: 52.9 % — ABNORMAL HIGH (ref 35.0–47.0)
Hemoglobin: 17.6 g/dL — ABNORMAL HIGH (ref 12.0–16.0)
MCH: 31.5 pg (ref 26.0–34.0)
MCHC: 33.2 g/dL (ref 32.0–36.0)
MCV: 94.8 fL (ref 80.0–100.0)
Platelets: 372 10*3/uL (ref 150–440)
RBC: 5.58 MIL/uL — ABNORMAL HIGH (ref 3.80–5.20)
RDW: 14.5 % (ref 11.5–14.5)
WBC: 13.6 10*3/uL — ABNORMAL HIGH (ref 3.6–11.0)

## 2017-02-18 LAB — URINALYSIS, ROUTINE W REFLEX MICROSCOPIC
Bilirubin Urine: NEGATIVE
Glucose, UA: NEGATIVE mg/dL
Ketones, ur: 5 mg/dL — AB
Nitrite: NEGATIVE
Protein, ur: 30 mg/dL — AB
Specific Gravity, Urine: 1.028 (ref 1.005–1.030)
pH: 5 (ref 5.0–8.0)

## 2017-02-18 LAB — LIPASE, BLOOD: Lipase: 27 U/L (ref 11–51)

## 2017-02-18 MED ORDER — ONDANSETRON HCL 4 MG/2ML IJ SOLN
4.0000 mg | Freq: Once | INTRAMUSCULAR | Status: AC
  Administered 2017-02-18: 4 mg via INTRAVENOUS
  Filled 2017-02-18: qty 2

## 2017-02-18 MED ORDER — KETOROLAC TROMETHAMINE 30 MG/ML IJ SOLN
30.0000 mg | Freq: Once | INTRAMUSCULAR | Status: AC
  Administered 2017-02-18: 30 mg via INTRAVENOUS
  Filled 2017-02-18: qty 1

## 2017-02-18 MED ORDER — SODIUM CHLORIDE 0.9 % IV BOLUS (SEPSIS)
1000.0000 mL | Freq: Once | INTRAVENOUS | Status: AC
  Administered 2017-02-18: 1000 mL via INTRAVENOUS

## 2017-02-18 NOTE — ED Notes (Signed)
2 unsuccessful PIV attempts by this RN. Azucena KubaJen D, primary RN, made aware.

## 2017-02-18 NOTE — ED Notes (Signed)
EDP aware of need for IV team to start

## 2017-02-18 NOTE — ED Triage Notes (Signed)
Per pt at 2pm she started with sudden onset nausea and LUQ abd pain. Prior to that she had felt fine. Pt vomitted on EMS ride

## 2017-02-18 NOTE — ED Provider Notes (Signed)
[facility] Emergency Department Provider Note  Time seen: 9:27 PM  I have reviewed the triage vital signs and the nursing notes.   HISTORY  Chief Complaint Nausea and Vomiting    HPI Desiree Mosley is a 32 y.o. female with no past medical history who presents to the emergency department for nausea vomiting and diarrhea.  According to the patient around 2 PM today she began feeling somewhat nauseated.  Around 4:00 today she began vomiting with several episodes of diarrhea.  Patient states abdominal discomfort which she describes as a cramping dull pain in the left upper quadrant of her abdomen.  Denies any fever (99.4 in the emergency department).  Denies any dysuria, hematuria, congestion.  States slight cough that just started.  Describes significant nausea currently in mild abdominal discomfort.   No past medical history on file.  There are no active problems to display for this patient.   No past surgical history on file.  Prior to Admission medications   Medication Sig Start Date End Date Taking? Authorizing Provider  brompheniramine-pseudoephedrine-DM 30-2-10 MG/5ML syrup Take 10 mLs by mouth 4 (four) times daily as needed. 06/23/16   Cuthriell, Delorise Royals, PA-C  magic mouthwash w/lidocaine SOLN Take 5 mLs by mouth 4 (four) times daily. 06/23/16   Cuthriell, Delorise Royals, PA-C    No Known Allergies  Family History  Problem Relation Age of Onset  . Cancer Mother   . Thyroid disease Mother   . Cancer Father   . Cancer Maternal Grandmother     Social History Social History   Tobacco Use  . Smoking status: Current Every Day Smoker    Packs/day: 1.00    Types: Cigarettes  . Smokeless tobacco: Never Used  Substance Use Topics  . Alcohol use: No  . Drug use: No    Review of Systems Constitutional: Negative for fever. Eyes: Negative for visual complaints ENT: Negative for recent illness/congestion Cardiovascular: Negative for chest  pain. Respiratory: Negative for shortness of breath.  Slight cough today. Gastrointestinal: Left upper quadrant abdominal cramping.  Positive for nausea vomiting diarrhea beginning approximate 4 PM. Genitourinary: Negative for dysuria.  Negative for vaginal bleeding or discharge.  Last period was on 02/11/17 Musculoskeletal: Negative for musculoskeletal complaints Skin: Negative for skin complaints  Neurological: Negative for headache All other ROS negative  ____________________________________________   PHYSICAL EXAM:  VITAL SIGNS: ED Triage Vitals  Enc Vitals Group     BP --      Pulse --      Resp 02/18/17 2114 20     Temp 02/18/17 2114 99.4 F (37.4 C)     Temp Source 02/18/17 2114 Oral     SpO2 02/18/17 2114 96 %     Weight 02/18/17 2116 145 lb (65.8 kg)     Height 02/18/17 2116 5\' 6"  (1.676 m)     Head Circumference --      Peak Flow --      Pain Score 02/18/17 2116 10     Pain Loc --      Pain Edu? --      Excl. in GC? --     Constitutional: Alert and oriented. Well appearing and in no distress. Eyes: Normal exam ENT   Head: Normocephalic and atraumatic   Mouth/Throat: Mucous membranes are moist. Cardiovascular: Normal rate, regular rhythm. No murmur Respiratory: Normal respiratory effort without tachypnea nor retractions. Breath sounds are clear Gastrointestinal: Soft and nontender. No distention. Musculoskeletal: Nontender with normal range  of motion in all extremities. Neurologic:  Normal speech and language. No gross focal neurologic deficits  Skin:  Skin is warm, dry and intact.  Psychiatric: Mood and affect are normal.   ____________________________________________   INITIAL IMPRESSION / ASSESSMENT AND PLAN / ED COURSE  Pertinent labs & imaging results that were available during my care of the patient were reviewed by me and considered in my medical decision making (see chart for details).  Patient presents to the emergency department for nausea  vomiting diarrhea with left upper quadrant abdominal discomfort.  Symptoms started acutely around 2 PM.  Differential would include gastroenteritis, enteritis, viral illness, intra-abdominal pathology.  We will check labs, treat nausea, dose Toradol for discomfort, IV hydrate and continue to closely monitor.  CMP and lipase are pending, patient was a very difficult stick requiring IV team.  Patient care signed out to Dr. Dolores FrameSung.  ____________________________________________   FINAL CLINICAL IMPRESSION(S) / ED DIAGNOSES  Nausea vomiting diarrhea    Minna AntisPaduchowski, Denver Bentson, MD 02/18/17 2345

## 2017-02-19 LAB — PREGNANCY, URINE: Preg Test, Ur: NEGATIVE

## 2017-02-19 MED ORDER — DEXTROSE-NACL 5-0.45 % IV SOLN
INTRAVENOUS | Status: DC
  Administered 2017-02-19: 01:00:00 via INTRAVENOUS

## 2017-02-19 MED ORDER — FOSFOMYCIN TROMETHAMINE 3 G PO PACK
3.0000 g | PACK | Freq: Once | ORAL | Status: AC
  Administered 2017-02-19: 3 g via ORAL
  Filled 2017-02-19: qty 3

## 2017-02-19 MED ORDER — ONDANSETRON 4 MG PO TBDP: 4.0000 mg | ORAL_TABLET | Freq: Three times a day (TID) | ORAL | 0 refills | Status: DC | PRN

## 2017-02-19 NOTE — ED Notes (Signed)
Called lab to confirm UA pregnancy recieved

## 2017-02-19 NOTE — Discharge Instructions (Signed)
1.  You may take Zofran as needed for nausea. 2.  Clear liquids for 12 hours, then BRAT diet for 3 days, then slowly advance diet as tolerated. 3.  Take the antibiotic packet as directed tomorrow if you have not vomited for over 12 hours. 4.  Return to the ER for worsening symptoms, persistent vomiting, difficulty breathing or other concerns.

## 2017-02-19 NOTE — ED Notes (Signed)

## 2017-02-19 NOTE — ED Provider Notes (Signed)
-----------------------------------------   12:24 AM on 02/19/2017 -----------------------------------------  Laboratory results noted.  Elevated bilirubin most likely secondary to vomiting.  Will infuse second liter fluids D5 half normal saline.   ----------------------------------------- 2:23 AM on 02/19/2017 -----------------------------------------  Patient feeling much better.  Tolerated popsicle without emesis.  Will discharge home with Zofran to use as needed, fosfomycin packet for mild UTI, and she will follow-up closely with her PCP.  Strict return precautions given.  Patient verbalizes understanding and agrees with plan of care.   Irean HongSung, Jade J, MD 02/19/17 [phone]

## 2017-05-16 ENCOUNTER — Encounter: Payer: Self-pay | Admitting: Emergency Medicine

## 2017-05-16 ENCOUNTER — Emergency Department
Admission: EM | Admit: 2017-05-16 | Discharge: 2017-05-16 | Disposition: A | Discharge status: Home / Self Care | Payer: Medicaid Other | Attending: Emergency Medicine | Admitting: Emergency Medicine

## 2017-05-16 ENCOUNTER — Emergency Department: Payer: Medicaid Other

## 2017-05-16 DIAGNOSIS — O99331 Smoking (tobacco) complicating pregnancy, first trimester: Secondary | ICD-10-CM | POA: Insufficient documentation

## 2017-05-16 DIAGNOSIS — F17210 Nicotine dependence, cigarettes, uncomplicated: Secondary | ICD-10-CM | POA: Insufficient documentation

## 2017-05-16 DIAGNOSIS — Z79899 Other long term (current) drug therapy: Secondary | ICD-10-CM | POA: Diagnosis not present

## 2017-05-16 DIAGNOSIS — O209 Hemorrhage in early pregnancy, unspecified: Secondary | ICD-10-CM | POA: Diagnosis present

## 2017-05-16 DIAGNOSIS — O200 Threatened abortion: Secondary | ICD-10-CM

## 2017-05-16 LAB — CBC
HCT: 41.4 % (ref 35.0–47.0)
Hemoglobin: 13.9 g/dL (ref 12.0–16.0)
MCH: 32.3 pg (ref 26.0–34.0)
MCHC: 33.7 g/dL (ref 32.0–36.0)
MCV: 95.7 fL (ref 80.0–100.0)
Platelets: 307 10*3/uL (ref 150–440)
RBC: 4.32 MIL/uL (ref 3.80–5.20)
RDW: 14.3 % (ref 11.5–14.5)
WBC: 7.5 10*3/uL (ref 3.6–11.0)

## 2017-05-16 LAB — COMPREHENSIVE METABOLIC PANEL WITH GFR
ALT: 12 U/L — ABNORMAL LOW (ref 14–54)
AST: 18 U/L (ref 15–41)
Albumin: 4 g/dL (ref 3.5–5.0)
Alkaline Phosphatase: 63 U/L (ref 38–126)
Anion gap: 7 (ref 5–15)
BUN: 10 mg/dL (ref 6–20)
CO2: 24 mmol/L (ref 22–32)
Calcium: 8.6 mg/dL — ABNORMAL LOW (ref 8.9–10.3)
Chloride: 104 mmol/L (ref 101–111)
Creatinine, Ser: 0.71 mg/dL (ref 0.44–1.00)
GFR calc Af Amer: >60 mL/min
GFR calc non Af Amer: >60 mL/min
Glucose, Bld: 119 mg/dL — ABNORMAL HIGH (ref 65–99)
Potassium: 3.5 mmol/L (ref 3.5–5.1)
Sodium: 135 mmol/L (ref 135–145)
Total Bilirubin: 0.6 mg/dL (ref 0.3–1.2)
Total Protein: 7.2 g/dL (ref 6.5–8.1)

## 2017-05-16 LAB — POCT PREGNANCY, URINE: Preg Test, Ur: POSITIVE — AB

## 2017-05-16 LAB — HCG, QUANTITATIVE, PREGNANCY: hCG, Beta Chain, Quant, S: 13082 m[IU]/mL — ABNORMAL HIGH

## 2017-05-16 NOTE — ED Notes (Signed)

## 2017-05-16 NOTE — ED Triage Notes (Signed)
Pt reports she is is approximately 2 months pregnant and this am when she went to the bathroom she was dripping blood. Pt reports some clots as well. Pt also c/o pain.

## 2017-05-16 NOTE — ED Provider Notes (Signed)
[facility] Emergency Department Provider Note   ____________________________________________    I have reviewed the triage vital signs and the nursing notes.   HISTORY  Chief Complaint Vaginal Bleeding     HPI Desiree Mosley is a 32 y.o. female who presents with complaints of vaginal bleeding.  Patient reports she just found out 3 days ago that she was pregnant.  LMP 2 months ago approximately.  G5, P3, 1 miscarriage.  Complains of mild left-sided lower pelvic cramping.  Got up this morning and noticed vaginal bleeding.  No fevers.  Has not seen OB yet.  Has health department appointment   History reviewed. No pertinent past medical history.  There are no active problems to display for this patient.   Past Surgical History:  Procedure Laterality Date  . miscarrage      Prior to Admission medications   Medication Sig Start Date End Date Taking? Authorizing Provider  brompheniramine-pseudoephedrine-DM 30-2-10 MG/5ML syrup Take 10 mLs by mouth 4 (four) times daily as needed. Patient not taking: Reported on 05/16/2017 06/23/16   Cuthriell, Delorise Royals, PA-C  magic mouthwash w/lidocaine SOLN Take 5 mLs by mouth 4 (four) times daily. Patient not taking: Reported on 05/16/2017 06/23/16   Cuthriell, Delorise Royals, PA-C  ondansetron (ZOFRAN ODT) 4 MG disintegrating tablet Take 1 tablet (4 mg total) by mouth every 8 (eight) hours as needed for nausea or vomiting. Patient not taking: Reported on 05/16/2017 02/19/17   Irean Hong, MD     Allergies Patient has no known allergies.  Family History  Problem Relation Age of Onset  . Cancer Mother   . Thyroid disease Mother   . Cancer Father   . Cancer Maternal Grandmother     Social History Social History   Tobacco Use  . Smoking status: Current Every Day Smoker    Packs/day: 1.00    Types: Cigarettes  . Smokeless tobacco: Never Used  Substance Use Topics  . Alcohol use: No  . Drug use: No     Review of Systems  Constitutional: No fever Eyes: No visual changes.  ENT: No sore throat. Cardiovascular: Denies chest pain. Respiratory: No cough Gastrointestinal: Pelvic discomfort as above Genitourinary: As above Musculoskeletal: Negative for back pain. Skin: Negative for rash. Neurological: Negative for headaches or weakness   ____________________________________________   PHYSICAL EXAM:  VITAL SIGNS: ED Triage Vitals  Enc Vitals Group     BP 05/16/17 0734 107/61     Pulse Rate 05/16/17 0732 87     Resp 05/16/17 0732 20     Temp 05/16/17 0732 98.3 F (36.8 C)     Temp Source 05/16/17 0732 Oral     SpO2 05/16/17 0732 96 %     Weight 05/16/17 0733 69.9 kg (154 lb)     Height 05/16/17 0733 1.676 m ( )     Head Circumference --      Peak Flow --      Pain Score 05/16/17 0733 5     Pain Loc --      Pain Edu? --      Excl. in GC? --     Constitutional: Alert and oriented.  Pleasant and interactive Eyes: Conjunctivae are normal.   Nose: No congestion/rhinnorhea. Mouth/Throat: Mucous membranes are moist.    Cardiovascular: Normal rate, regular rhythm. Grossly normal heart sounds.  Good peripheral circulation. Respiratory: Normal respiratory effort.  No retractions.  Gastrointestinal: Soft and nontender. No distention.  Genitourinary: deferred Musculoskeletal:  Warm and well perfused Neurologic:  Normal speech and language. No gross focal neurologic deficits are appreciated.  Skin:  Skin is warm, dry and intact. No rash noted. Psychiatric: Mood and affect are normal. Speech and behavior are normal.  ____________________________________________   LABS (all labs ordered are listed, but only abnormal results are displayed)  Labs Reviewed  HCG, QUANTITATIVE, PREGNANCY - Abnormal; Notable for the following components:      Result Value   hCG, Beta Chain, Quant, S 13,082 (*)    All other components within normal limits  COMPREHENSIVE METABOLIC PANEL -  Abnormal; Notable for the following components:   Glucose, Bld 119 (*)    Calcium 8.6 (*)    ALT 12 (*)    All other components within normal limits  POCT PREGNANCY, URINE - Abnormal; Notable for the following components:   Preg Test, Ur POSITIVE (*)    All other components within normal limits  CBC   ____________________________________________  EKG  None ____________________________________________  RADIOLOGY  Ultrasound pending ____________________________________________   PROCEDURES  Procedure(s) performed: No  Procedures   Critical Care performed: No ____________________________________________   INITIAL IMPRESSION / ASSESSMENT AND PLAN / ED COURSE  Pertinent labs & imaging results that were available during my care of the patient were reviewed by me and considered in my medical decision making (see chart for details).  Patient presents with vaginal bleeding, she feels that she is approximately [redacted] weeks pregnant.  Will send , hCG, obtain ultrasound and reevaluate.  Rh+  Ultrasound demonstrates no exact gestational sac however no fetal pole, discussed with patient and the need for outpatient follow-up for repeat beta, repeat ultrasound    ____________________________________________   FINAL CLINICAL IMPRESSION(S) / ED DIAGNOSES  Final diagnoses:  Threatened miscarriage        Note:  This document was prepared using Dragon voice recognition software and may include unintentional dictation errors.    Jene Every, MD 05/16/17 1120

## 2017-05-20 ENCOUNTER — Encounter: Payer: Self-pay | Admitting: Obstetrics and Gynecology

## 2017-05-20 ENCOUNTER — Ambulatory Visit (INDEPENDENT_AMBULATORY_CARE_PROVIDER_SITE_OTHER): Payer: Medicaid Other | Admitting: Obstetrics and Gynecology

## 2017-05-20 VITALS — BP 92/58 | HR 88 | Ht 66.0 in | Wt 157.0 lb

## 2017-05-20 DIAGNOSIS — O209 Hemorrhage in early pregnancy, unspecified: Secondary | ICD-10-CM

## 2017-05-20 DIAGNOSIS — O3680X0 Pregnancy with inconclusive fetal viability, not applicable or unspecified: Secondary | ICD-10-CM

## 2017-05-20 DIAGNOSIS — Z3689 Encounter for other specified antenatal screening: Secondary | ICD-10-CM | POA: Diagnosis not present

## 2017-05-20 NOTE — Progress Notes (Signed)
Obstetric Problem Visit    Chief Complaint:  Chief Complaint  Patient presents with  . ER follow up    First trimester bleeding    History of Present Illness: Patient is a 32 y.o. [id number] Unknown presenting for first trimester bleeding.  The onset of bleeding was 1 week ago.  Is bleeding equal to or greater than normal menstrual flow:  No Any recent trauma:  No Recent intercourse:  No History of prior miscarriage:  Yes Prior ultrasound demonstrating IUP: emergency room visit on 05/16/17.  Prior ultrasound demonstrating viable IUP:  No Prior Serum HCG:  Yes 13,027mIU/mL 05/16/17 Rh status: positive  Review of Systems: Review of Systems  Constitutional: Negative for chills and fever.  HENT: Negative for congestion.   Respiratory: Negative for cough and shortness of breath.   Cardiovascular: Negative for chest pain and palpitations.  Gastrointestinal: Negative for abdominal pain, constipation, diarrhea, heartburn, nausea and vomiting.  Genitourinary: Negative for dysuria, frequency and urgency.  Skin: Negative for itching and rash.  Neurological: Negative for dizziness and headaches.  Endo/Heme/Allergies: Negative for polydipsia.  Psychiatric/Behavioral: Negative for depression.    Past Medical History:  No past medical history on file.  Past Surgical History:  Past Surgical History:  Procedure Laterality Date  . miscarrage      Obstetric History: [id number]  Family History:  Family History  Problem Relation Age of Onset  . Cancer Mother   . Thyroid disease Mother   . Cancer Father   . Cancer Maternal Grandmother     Social History:  Social History   Socioeconomic History  . Marital status: Single    Spouse name: Not on file  . Number of children: Not on file  . Years of education: Not on file  . Highest education level: Not on file  Occupational History  . Not on file  Social Needs  . Financial resource strain: Not on file  . Food insecurity:    Worry:  Not on file    Inability: Not on file  . Transportation needs:    Medical: Not on file    Non-medical: Not on file  Tobacco Use  . Smoking status: Current Every Day Smoker    Packs/day: 1.00    Types: Cigarettes  . Smokeless tobacco: Never Used  Substance and Sexual Activity  . Alcohol use: No  . Drug use: No  . Sexual activity: Yes    Birth control/protection: IUD, None  Lifestyle  . Physical activity:    Days per week: Not on file    Minutes per session: Not on file  . Stress: Not on file  Relationships  . Social connections:    Talks on phone: Not on file    Gets together: Not on file    Attends religious service: Not on file    Active member of club or organization: Not on file    Attends meetings of clubs or organizations: Not on file    Relationship status: Not on file  . Intimate partner violence:    Fear of current or ex partner: Not on file    Emotionally abused: Not on file    Physically abused: Not on file    Forced sexual activity: Not on file  Other Topics Concern  . Not on file  Social History Narrative  . Not on file    Allergies:  No Known Allergies  Medications: Prior to Admission medications   Medication Sig Start Date End Date Taking? Authorizing Provider  brompheniramine-pseudoephedrine-DM 30-2-10 MG/5ML syrup Take 10 mLs by mouth 4 (four) times daily as needed. Patient not taking: Reported on 05/16/2017 06/23/16   Cuthriell, Delorise Royals, PA-C  magic mouthwash w/lidocaine SOLN Take 5 mLs by mouth 4 (four) times daily. Patient not taking: Reported on 05/16/2017 06/23/16   Cuthriell, Delorise Royals, PA-C  ondansetron (ZOFRAN ODT) 4 MG disintegrating tablet Take 1 tablet (4 mg total) by mouth every 8 (eight) hours as needed for nausea or vomiting. Patient not taking: Reported on 05/16/2017 02/19/17   Irean Hong, MD    Physical Exam Vitals: Blood pressure (!) 92/58, pulse 88, height  (1.676 m), weight 157 lb (71.2 kg), last menstrual period  03/19/2017, unknown if currently breastfeeding. General: NAD HEENT: normocephalic, anicteric Pulmonary: No increased work of breathing, Extremities: no edema, erythema, or tenderness Neurologic: Grossly intact Psychiatric: mood appropriate, affect full  Assessment: 32 y.o. [id number] Unknown presenting for evaluation of first trimester vaginal bleeding  Plan: Problem List Items Addressed This Visit    None    Visit Diagnoses    First trimester bleeding    -  Primary   Relevant Orders   US OB Comp Less 14 Wks   Beta hCG quant (ref lab) (Completed)   Pregnancy with uncertain fetal viability, single or unspecified fetus       Relevant Orders   US OB Comp Less 14 Wks   Beta hCG quant (ref lab) (Completed)   Encounter to establish gestational age using ultrasound       Relevant Orders   US OB Comp Less 14 Wks   Beta hCG quant (ref lab) (Completed)      1) First trimester bleeding - incidence and clinical course of first trimester bleeding is discussed in detail with the patient today.  Approximately 1/3 of pregnancies ending in live births experienced 1st trimester bleeding.  The amount of bleeding is variable and not necessarily predictive of outcome.  Sources may be cervical or uterine.  Subchorionic hemorrhages are a frequent concurrent findings on ultrasound and are followed expectantly.  These often absorb or regress spontaneously although risk for expansion and further disruption of the utero-placental interface leading to miscarriage is possible.  There is no clearly documented benefit to limiting or modifying activity and sexual intercourse in altering clinic course of 1st trimester bleeding. Bleeding has stopped but we did discuss new study in [place] Journal of Medicine published last week May 8th that examined progesterone use in patient with first trimester bleeding.  Study revealed possible benefits for patient with a prior 1st trimester loss.  At present bleeding has stopped  but if restart would consider starting progesterone.     2) Follow up ultrasound ordered  "Society of [employer]iology[employer] in Ultrasound Guidelines for Transvaginal Ultrasonographic Diagnosis of Early Pregnancy Loss" and adopted in ACOG Practice Bulletin Number 150, May 2015 (reaffirmed 2017) "Early Pregnancy Loss"  - CRL of 7mm or greater and absence of fetal heartbeat  - Mean sac diameter of 25mm or greater and no embryo  - Absence of embryo with a discernable heartbeat 2 week after initial ultrasound showing a gestational sac without a yolk sac  - Absence of embryo with a discernable heartbeat 11 days after initial ultrasound showing a gestational sac with  yolk sac present  3) The patient is Rh positive, rhogam is therefore not indicated to decrease the risk rhesus alloimmunization.    4) Routine bleeding precautions were discussed with the patient prior the conclusion of today's  visit.    Vena Austria, MD, [facility], [facility] 05/20/2017, 4:00 PM

## 2017-05-21 LAB — BETA HCG QUANT (REF LAB): hCG Quant: 34195 m[IU]/mL

## 2017-05-27 ENCOUNTER — Ambulatory Visit (INDEPENDENT_AMBULATORY_CARE_PROVIDER_SITE_OTHER): Payer: Medicaid Other | Admitting: Obstetrics and Gynecology

## 2017-05-27 ENCOUNTER — Encounter: Payer: Self-pay | Admitting: Obstetrics and Gynecology

## 2017-05-27 ENCOUNTER — Ambulatory Visit (INDEPENDENT_AMBULATORY_CARE_PROVIDER_SITE_OTHER): Payer: Medicaid Other

## 2017-05-27 VITALS — BP 110/66 | Wt 157.0 lb

## 2017-05-27 DIAGNOSIS — Z3689 Encounter for other specified antenatal screening: Secondary | ICD-10-CM

## 2017-05-27 DIAGNOSIS — O200 Threatened abortion: Secondary | ICD-10-CM | POA: Diagnosis not present

## 2017-05-27 DIAGNOSIS — Z3A01 Less than 8 weeks gestation of pregnancy: Secondary | ICD-10-CM | POA: Diagnosis not present

## 2017-05-27 DIAGNOSIS — O209 Hemorrhage in early pregnancy, unspecified: Secondary | ICD-10-CM

## 2017-05-27 DIAGNOSIS — O3680X0 Pregnancy with inconclusive fetal viability, not applicable or unspecified: Secondary | ICD-10-CM

## 2017-05-27 NOTE — Progress Notes (Addendum)
Gynecology Ultrasound Follow Up   Chief Complaint  Patient presents with  . NOB    dating scan   History of Present Illness: Patient is a 32 y.o. female [phone] who presents today for ultrasound evaluation of early pregnancy bleeding and dating/viability .  Ultrasound demonstrates the following findings Adnexa: no masses seen   Single living intrauterine pregnancy with CRL consistent with EDD of 01/15/2018 and current gestational age of [redacted]w[redacted]d.  Positive cardiac activity with rate of 151 bpm.  CRL is 8.1 mm.  Yolk sac appears normal.   There is a moderate size subchorionic hemorrhage that surrounds approximately half the gestational sac.    Blood type is O+.   Past medical history: denies:   Past Surgical History:  Procedure Laterality Date  . miscarrage      Family History  Problem Relation Age of Onset  . Cancer Mother   . Thyroid disease Mother   . Cancer Father   . Cancer Maternal Grandmother     Social History   Socioeconomic History  . Marital status: Single    Spouse name: Not on file  . Number of children: Not on file  . Years of education: Not on file  . Highest education level: Not on file  Occupational History  . Not on file  Social Needs  . Financial resource strain: Not on file  . Food insecurity:    Worry: Not on file    Inability: Not on file  . Transportation needs:    Medical: Not on file    Non-medical: Not on file  Tobacco Use  . Smoking status: Current Every Day Smoker    Packs/day: 1.00    Types: Cigarettes  . Smokeless tobacco: Never Used  Substance and Sexual Activity  . Alcohol use: No  . Drug use: No  . Sexual activity: Yes    Birth control/protection: IUD, None  Lifestyle  . Physical activity:    Days per week: Not on file    Minutes per session: Not on file  . Stress: Not on file  Relationships  . Social connections:    Talks on phone: Not on file    Gets together: Not on file    Attends religious service: Not on file     Active member of club or organization: Not on file    Attends meetings of clubs or organizations: Not on file    Relationship status: Not on file  . Intimate partner violence:    Fear of current or ex partner: Not on file    Emotionally abused: Not on file    Physically abused: Not on file    Forced sexual activity: Not on file  Other Topics Concern  . Not on file  Social History Narrative  . Not on file    No Known Allergies  Prior to Admission medications   Medication Sig Start Date End Date Taking? Authorizing Provider  brompheniramine-pseudoephedrine-DM 30-2-10 MG/5ML syrup Take 10 mLs by mouth 4 (four) times daily as needed. Patient not taking: Reported on 05/16/2017 06/23/16   Cuthriell, Delorise Royals, PA-C  magic mouthwash w/lidocaine SOLN Take 5 mLs by mouth 4 (four) times daily. Patient not taking: Reported on 05/16/2017 06/23/16   Cuthriell, Delorise Royals, PA-C  ondansetron (ZOFRAN ODT) 4 MG disintegrating tablet Take 1 tablet (4 mg total) by mouth every 8 (eight) hours as needed for nausea or vomiting. Patient not taking: Reported on 05/16/2017 02/19/17   Irean Hong, MD  Physical Exam BP 110/66   Wt 157 lb (71.2 kg)   LMP 03/23/2017 (Approximate)   BMI 25.34 kg/m    General: NAD HEENT: normocephalic, anicteric Pulmonary: No increased work of breathing Extremities: no edema, erythema, or tenderness Neurologic: Grossly intact, normal gait Psychiatric: mood appropriate, affect full  US Ob Comp Less 14 Wks  Result Date: 05/27/2017 ULTRASOUND REPORT Patient Name: Desiree Mosley DOB: 1985/04/16 MRN: [id number] Location: [facility] Date of Service: 05/27/2017 Indications:viability, dating Findings: Singleton intrauterine pregnancy is visualized with a CRL consistent with [redacted]w[redacted]d gestation, giving an (U/S) EDD of 01/15/2018. The (U/S) EDD is not consistent with the clinically established EDD of 12/24/2017. FHR: 151 bpm CRL measurement: 8.1 mm Yolk sac is visualized and  appears normal and early anatomy is normal. Amnion: visualized and appears normal There is a moderate sized subchorionic hemorrhage surrounding approximately half of the gestational sac. Right Ovary is normal in appearance. Left Ovary is normal appearance. Corpus luteal cyst:  is not visualized Survey of the adnexa demonstrates no adnexal masses. There is no free peritoneal fluid in the cul de sac. Impression: 1. [redacted]w[redacted]d Viable Singleton Intrauterine pregnancy by U/S. 2. (U/S) EDD is not consistent with Clinically established EDD of 12/24/2017. 3. EDD is now 01/15/2018 based on today's ultrasound. 4. Moderate subchorionic hemorrhage Recommendations: 1.Clinical correlation with the patient's History and Physical Exam. 2. Follow up ultrasound in two weeks to assess resolution of subchorionic hemorrhage Willette Alma, RDMS, RVT There is a viable singleton gestation.  Detailed evaluation of the fetal anatomy is precluded by early gestational age.  It must be noted that a normal ultrasound particular at this early gestational age is unable to rule out fetal aneuploidy, risk of first trimester miscarriage, or anatomic birth defects. Thomasene Mohair, MD, [facility], [facility] 05/27/2017 10:51 AM    Assessment: 31 y.o. [id number]  1. Threatened abortion      Plan: Problem List Items Addressed This Visit    None    Visit Diagnoses    Threatened abortion    -  Primary   Relevant Orders   US OB Comp Less 14 Wks     She has been having some spotting. We discussed risk of miscarriage.  Discussed signs that miscarriage might be happening.  Discussed how much bleeding to expect (or upper end of normal), if miscarriage does occur. On the other hand, will get a repeat ultrasound in about a week to assess [facility].    20 minutes spent in face to face discussion with > 50% spent in counseling,management, and coordination of care of her threatened abortion.   Thomasene Mohair, MD, [facility], [facility] 05/28/2017 4:19 PM

## 2017-05-28 ENCOUNTER — Encounter: Payer: Self-pay | Admitting: Obstetrics and Gynecology

## 2017-05-29 ENCOUNTER — Encounter: Payer: Self-pay | Admitting: Obstetrics and Gynecology

## 2017-06-04 ENCOUNTER — Ambulatory Visit (INDEPENDENT_AMBULATORY_CARE_PROVIDER_SITE_OTHER): Payer: Medicaid Other | Admitting: Advanced Practice Midwife

## 2017-06-04 ENCOUNTER — Encounter: Payer: Self-pay | Admitting: Advanced Practice Midwife

## 2017-06-04 ENCOUNTER — Ambulatory Visit (INDEPENDENT_AMBULATORY_CARE_PROVIDER_SITE_OTHER): Payer: Medicaid Other

## 2017-06-04 VITALS — BP 104/66 | Wt 157.0 lb

## 2017-06-04 DIAGNOSIS — O200 Threatened abortion: Secondary | ICD-10-CM | POA: Diagnosis not present

## 2017-06-04 DIAGNOSIS — Z113 Encounter for screening for infections with a predominantly sexual mode of transmission: Secondary | ICD-10-CM

## 2017-06-04 DIAGNOSIS — Z3480 Encounter for supervision of other normal pregnancy, unspecified trimester: Secondary | ICD-10-CM | POA: Insufficient documentation

## 2017-06-04 DIAGNOSIS — Z3A01 Less than 8 weeks gestation of pregnancy: Secondary | ICD-10-CM

## 2017-06-04 NOTE — Progress Notes (Addendum)
New Obstetric Patient H&P    Chief Complaint: "Desires prenatal care"   History of Present Illness: Patient is a 32 y.o. [id number] Not Hispanic or Latino female, presents with amenorrhea and positive home pregnancy test. Patient's last menstrual period was 03/23/2017 (approximate). and based on 6 week 5 day ultrasoun, her EDD is Estimated Date of Delivery: 01/15/18 and her EGA is [redacted]w[redacted]d. Cycles are 4. days, regular, and occur approximately every : 28 days. Her last pap smear was 1 or 2 years ago and was no abnormalities.    She had a urine pregnancy test which was positive 4 week(s)  ago. Her last menstrual period was normal and lasted for  4 day(s). Since her LMP she claims she has experienced breast tenderness, fatigue, nausea. She denies any continued vaginal bleeding. Her past medical history is noncontributory. Her prior pregnancies are notable for G1 SAB, G2 2007 FT SVD 7 pounds 13 ounces, G3 2008 FT SVD 9 pounds 11 ounces, 2015 FT SVD 9 pounds 13 ounces  Since her LMP, she admits to the use of tobacco products  She quit 3 weeks ago and plans to not start again She claims she has gained   12 pounds since the start of her pregnancy.  There are cats in the home in the home  no  She admits close contact with children on a regular basis  yes  She has had chicken pox in the past yes She has had Tuberculosis exposures, symptoms, or previously tested positive for TB   no Current or past history of domestic violence. no  Genetic Screening/Teratology Counseling: (Includes patient, baby's father, or anyone in either family with:)   1. Patient's age >/= 35 at [facility]  no 2. Thalassemia ([place], [place], Mediterranean, or Asian background): MCV<80  no 3. Neural tube defect (meningomyelocele, spina bifida, anencephaly)  no 4. Congenital heart defect  no  5. Down syndrome  no 6. Tay-Sachs (Jewish, [place]  no 7. Canavan's Disease  no 8. Sickle cell disease or trait (African)  no  9. Hemophilia  or other blood disorders  no  10. Muscular dystrophy  no  11. Cystic fibrosis  no  12. Huntington's Chorea  no  13. Mental retardation/autism  no 14. Other inherited genetic or chromosomal disorder  no 15. Maternal metabolic disorder (DM, PKU, etc)  no 16. Patient or FOB with a child with a birth defect not listed above no  16a. Patient or FOB with a birth defect themselves no 17. Recurrent pregnancy loss, or stillbirth  no  18. Any medications since LMP other than prenatal vitamins (include vitamins, supplements, OTC meds, drugs, alcohol)  no 19. Any other genetic/environmental exposure to discuss  no  Infection History:   1. Lives with someone with TB or TB exposed  no  2. Patient or partner has history of genital herpes  no 3. Rash or viral illness since LMP  no 4. History of STI (GC, CT, HPV, syphilis, HIV)  no 5. History of recent travel :  no  Other pertinent information:  no     Review of Systems:10 point review of systems negative unless otherwise noted in HPI  Past Medical History:  History reviewed. No pertinent past medical history.  Past Surgical History:  Past Surgical History:  Procedure Laterality Date  . miscarrage      Gynecologic History: Patient's last menstrual period was 03/23/2017 (approximate).  Obstetric History: [id number]  Family History:  Family History  Problem Relation Age of  Onset  . Cancer Mother   . Thyroid disease Mother   . Cancer Father   . Cancer Maternal Grandmother     Social History:  Social History   Socioeconomic History  . Marital status: Single    Spouse name: Not on file  . Number of children: Not on file  . Years of education: Not on file  . Highest education level: Not on file  Occupational History  . Not on file  Social Needs  . Financial resource strain: Not on file  . Food insecurity:    Worry: Not on file    Inability: Not on file  . Transportation needs:    Medical: Not on file    Non-medical: Not on  file  Tobacco Use  . Smoking status: Former Smoker    Types: Cigarettes  . Smokeless tobacco: Never Used  Substance and Sexual Activity  . Alcohol use: No  . Drug use: No  . Sexual activity: Yes    Birth control/protection: IUD, None  Lifestyle  . Physical activity:    Days per week: Not on file    Minutes per session: Not on file  . Stress: Not on file  Relationships  . Social connections:    Talks on phone: Not on file    Gets together: Not on file    Attends religious service: Not on file    Active member of club or organization: Not on file    Attends meetings of clubs or organizations: Not on file    Relationship status: Not on file  . Intimate partner violence:    Fear of current or ex partner: Not on file    Emotionally abused: Not on file    Physically abused: Not on file    Forced sexual activity: Not on file  Other Topics Concern  . Not on file  Social History Narrative  . Not on file    Allergies:  No Known Allergies  Medications: Prior to Admission medications   Not on File    Physical Exam Vitals: Last menstrual period 03/23/2017  General: NAD HEENT: normocephalic, anicteric Thyroid: no enlargement, no palpable nodules Pulmonary: No increased work of breathing, CTAB Cardiovascular: RRR, distal pulses 2+ Abdomen: NABS, soft, non-tender, non-distended.  Umbilicus without lesions.  No hepatomegaly, splenomegaly or masses palpable. No evidence of hernia  Genitourinary:  External: Normal external female genitalia.  Normal urethral meatus, normal  Bartholin's and Skene's glands.    Vagina: Normal vaginal mucosa, no evidence of prolapse.    Cervix: Grossly normal in appearance, no bleeding, no CMT  Uterus: Enlarged, mobile, normal contour.    Adnexa: ovaries non-enlarged, no adnexal masses  Rectal: deferred Extremities: no edema, erythema, or tenderness Neurologic: Grossly intact Psychiatric: mood appropriate, affect full   Assessment: 32 y.o.  [id number] at [redacted]w[redacted]d presenting to initiate prenatal care  Plan: 1) Avoid alcoholic beverages. 2) Patient encouraged not to smoke.  3) Discontinue the use of all non-medicinal drugs and chemicals.  4) Take prenatal vitamins daily.  5) Nutrition, food safety (fish, cheese advisories, and high nitrite foods) and exercise discussed. 6) Hospital and practice style discussed with cross coverage system.  7) Genetic Screening, such as with 1st Trimester Screening, cell free fetal DNA, AFP testing, and Ultrasound, as well as with amniocentesis and CVS as appropriate, is discussed with patient. At the conclusion of today's visit patient declined genetic testing 8) Patient is asked about travel to areas at risk for the Zika virus, and counseled  to avoid travel and exposure to mosquitoes or sexual partners who may have themselves been exposed to the virus. Testing is discussed, and will be ordered as appropriate.  9) ROB in 4 weeks   Tresea Mall, CNM [facility], [facility] 06/04/2017, 11:19 AM

## 2017-06-04 NOTE — Patient Instructions (Signed)
Exercise During Pregnancy For people of all ages, exercise is an important part of being healthy. Exercise improves heart and lung function and helps to maintain strength, flexibility, and a healthy body weight. Exercise also boosts energy levels and elevates mood. For most women, maintaining an exercise routine throughout pregnancy is recommended. It is only on rare occasions and with certain medical conditions or pregnancy complications that women may be asked to limit or avoid exercise during pregnancy. What are some other benefits to exercising during pregnancy? Along with maintaining strength and flexibility, exercising throughout pregnancy can help to:  Keep strength in muscles that are very important during labor and childbirth.  Decrease low back pain during pregnancy.  Decrease the risk of developing gestational diabetes mellitus (GDM).  Improve blood sugar (glucose) control for women who have GDM.  Decrease the risk of developing preeclampsia. This is a serious condition that causes high blood pressure along with other symptoms, such as swelling and headaches.  Decrease the risk of cesarean delivery.  Speed up the recovery after giving birth.  How often should I exercise? Unless your health care provider gives you different instructions, you should try to exercise on most days or all days of the week. In general, try to exercise with moderate intensity for about 150 minutes per week. This can be spread out across several days, such as exercising for 30 minutes per day on 5 days of each week. You can tell that you are exercising at a moderate intensity if you have a higher heart rate and faster breathing, but you are still able to hold a conversation. What types of moderate-intensity exercise are recommended during pregnancy? There are many types of exercise that are safe for you to do during pregnancy. Unless your health care provider gives you different instructions, do a variety of  exercises that safely increase your heart and breathing (cardiopulmonary) rates and help you to build and maintain muscle strength (strength training). You should always be able to talk in full sentences while exercising during pregnancy. Some examples of exercising that is safe to do during pregnancy include:  Brisk walking or hiking.  Swimming.  Water aerobics.  Riding a stationary bike.  Strength training.  Modified yoga or Pilates. Tell your instructor that you are pregnant. Avoid overstretching and avoid lying on your back for long periods of time.  Running or jogging. Only choose this type of exercise if: ? You ran or jogged regularly before your pregnancy. ? You can run or jog and still talk in complete sentences.  What types of exercise should I not do during pregnancy? Depending on your level of fitness and whether you exercised regularly before your pregnancy, you may be advised to limit vigorous-intensity exercise during your pregnancy. You can tell that you are exercising at a vigorous intensity if you are breathing much harder and faster and cannot hold a conversation while exercising. Some examples of exercising that you should avoid during pregnancy include:  Contact sports.  Activities that place you at risk for falling on or being hit in the belly, such as downhill skiing, water skiing, surfing, rock climbing, cycling, gymnastics, and horseback riding.  Scuba diving.  Sky diving.  Yoga or Pilates in a room that is heated to extreme temperatures ("hot yoga" or "hot Pilates").  Jogging or running, unless you ran or jogged regularly before your pregnancy. While jogging or running, you should always be able to talk in full sentences. Do not run or jog so vigorously   that you are unable to have a conversation.  If you are not used to exercising at elevation (more than 6,000 feet above sea level), do not do so during your pregnancy.  When should I avoid exercising  during pregnancy? Certain medical conditions can make it unsafe to exercise during pregnancy, or they may increase your risk of miscarriage or early labor and birth. Some of these conditions include:  Some types of heart disease.  Some types of lung disease.  Placenta previa. This is when the placenta partially or completely covers the opening of the uterus (cervix).  Frequent bleeding from the vagina during your pregnancy.  Incompetent cervix. This is when your cervix does not remain as tightly closed during pregnancy as it should.  Premature labor.  Ruptured membranes. This is when the protective sac (amniotic sac) opens up and amniotic fluid leaks from your vagina.  Severely low blood count (anemia).  Preeclampsia or pregnancy-caused high blood pressure.  Carrying more than one baby (multiple gestation) and having an additional risk of early labor.  Poorly controlled diabetes.  Being severely underweight or severely overweight.  Intrauterine growth restriction. This is when your baby's growth and development during pregnancy are slower than expected.  Other medical conditions. Ask your health care provider if any apply to you.  What else should I know about exercising during pregnancy? You should take these precautions while exercising during pregnancy:  Avoid overheating. ? Wear loose-fitting, breathable clothes. ? Do not exercise in very high temperatures.  Avoid dehydration. Drink enough water before, during, and after exercise to keep your urine clear or pale yellow.  Avoid overstretching. Because of hormone changes during pregnancy, it is easy to overstretch muscles, tendons, and ligaments during pregnancy.  Start slowly and ask your health care provider to recommend types of exercise that are safe for you, if exercising regularly is new for you.  Pregnancy is not a time for exercising to lose weight. When should I seek medical care? You should stop exercising  and call your health care provider if you have any unusual symptoms, such as:  Mild uterine contractions or abdominal cramping.  Dizziness that does not improve with rest.  When should I seek immediate medical care? You should stop exercising and call your local emergency services (911 in the [place] if you have any unusual symptoms, such as:  Sudden, severe pain in your low back or your belly.  Uterine contractions or abdominal cramping that do not improve with rest.  Chest pain.  Bleeding or fluid leaking from your vagina.  Shortness of breath.  This information is not intended to replace advice given to you by your health care provider. Make sure you discuss any questions you have with your health care provider. Document Released: 12/23/2004 Document Revised: 05/23/2015 Document Reviewed: 03/02/2014 Elsevier Interactive Patient Education  2018 [employer] Eating Plan for Pregnant Women While you are pregnant, your body will require additional nutrition to help support your growing baby. It is recommended that you consume:  150 additional calories each day during your first trimester.  300 additional calories each day during your second trimester.  300 additional calories each day during your third trimester.  Eating a healthy, well-balanced diet is very important for your health and for your baby's health. You also have a higher need for some vitamins and minerals, such as folic acid, calcium, iron, and vitamin D. What do I need to know about eating during pregnancy?  Do not try to lose weight   or go on a diet during pregnancy.  Choose healthy, nutritious foods. Choose  of a sandwich with a glass of milk instead of a candy bar or a high-calorie sugar-sweetened beverage.  Limit your overall intake of foods that have "empty calories." These are foods that have little nutritional value, such as sweets, desserts, candies, sugar-sweetened beverages, and fried foods.  Eat a  variety of foods, especially fruits and vegetables.  Take a prenatal vitamin to help meet the additional needs during pregnancy, specifically for folic acid, iron, calcium, and vitamin D.  Remember to stay active. Ask your health care provider for exercise recommendations that are specific to you.  Practice good food safety and cleanliness, such as washing your hands before you eat and after you prepare raw meat. This helps to prevent foodborne illnesses, such as listeriosis, that can be very dangerous for your baby. Ask your health care provider for more information about listeriosis. What does 150 extra calories look like? Healthy options for an additional 150 calories each day could be any of the following:  Plain low-fat yogurt (6-8 oz) with  cup of berries.  1 apple with 2 teaspoons of peanut butter.  Cut-up vegetables with  cup of hummus.  Low-fat chocolate milk (8 oz or 1 cup).  1 string cheese with 1 medium orange.   of a peanut butter and jelly sandwich on whole-wheat bread (1 tsp of peanut butter).  For 300 calories, you could eat two of those healthy options each day. What is a healthy amount of weight to gain? The recommended amount of weight for you to gain is based on your pre-pregnancy BMI. If your pre-pregnancy BMI was:  Less than 18 (underweight), you should gain 28-40 lb.  18-24.9 (normal), you should gain 25-35 lb.  25-29.9 (overweight), you should gain 15-25 lb.  Greater than 30 (obese), you should gain 11-20 lb.  What if I am having twins or multiples? Generally, pregnant women who will be having twins or multiples may need to increase their daily calories by 300-600 calories each day. The recommended range for total weight gain is 25-54 lb, depending on your pre-pregnancy BMI. Talk with your health care provider for specific guidance about additional nutritional needs, weight gain, and exercise during your pregnancy. What foods can I eat? Grains Any  grains. Try to choose whole grains, such as whole-wheat bread, oatmeal, or brown rice. Vegetables Any vegetables. Try to eat a variety of colors and types of vegetables to get a full range of vitamins and minerals. Remember to wash your vegetables well before eating. Fruits Any fruits. Try to eat a variety of colors and types of fruit to get a full range of vitamins and minerals. Remember to wash your fruits well before eating. Meats and Other Protein Sources Lean meats, including chicken, turkey, fish, and lean cuts of beef, veal, or pork. Make sure that all meats are cooked to "well done." Tofu. Tempeh. Beans. Eggs. Peanut butter and other nut butters. Seafood, such as shrimp, crab, and lobster. If you choose fish, select types that are higher in omega-3 fatty acids, including salmon, herring, mussels, trout, sardines, and pollock. Make sure that all meats are cooked to food-safe temperatures. Dairy Pasteurized milk and milk alternatives. Pasteurized yogurt and pasteurized cheese. Cottage cheese. Sour cream. Beverages Water. Juices that contain 100% fruit juice or vegetable juice. Caffeine-free teas and decaffeinated coffee. Drinks that contain caffeine are okay to drink, but it is better to avoid caffeine. Keep your total caffeine   intake to less than 200 mg each day (12 oz of coffee, tea, or soda) or as directed by your health care provider. Condiments Any pasteurized condiments. Sweets and Desserts Any sweets and desserts. Fats and Oils Any fats and oils. The items listed above may not be a complete list of recommended foods or beverages. Contact your dietitian for more options. What foods are not recommended? Vegetables Unpasteurized (raw) vegetable juices. Fruits Unpasteurized (raw) fruit juices. Meats and Other Protein Sources Cured meats that have nitrates, such as bacon, salami, and hotdogs. Luncheon meats, bologna, or other deli meats (unless they are reheated until they are  steaming hot). Refrigerated pate, meat spreads from a meat counter, smoked seafood that is found in the refrigerated section of a store. Raw fish, such as sushi or sashimi. High mercury content fish, such as tilefish, shark, swordfish, and king mackerel. Raw meats, such as tuna or beef tartare. Undercooked meats and poultry. Make sure that all meats are cooked to food-safe temperatures. Dairy Unpasteurized (raw) milk and any foods that have raw milk in them. Soft cheeses, such as feta, queso blanco, queso fresco, Brie, Camembert cheeses, blue-veined cheeses, and Panela cheese (unless it is made with pasteurized milk, which must be stated on the label). Beverages Alcohol. Sugar-sweetened beverages, such as sodas, teas, or energy drinks. Condiments Homemade fermented foods and drinks, such as pickles, sauerkraut, or kombucha drinks. (Store-bought pasteurized versions of these are okay.) Other Salads that are made in the store, such as ham salad, chicken salad, egg salad, tuna salad, and seafood salad. The items listed above may not be a complete list of foods and beverages to avoid. Contact your dietitian for more information. This information is not intended to replace advice given to you by your health care provider. Make sure you discuss any questions you have with your health care provider. Document Released: 10/07/2013 Document Revised: 05/31/2015 Document Reviewed: 06/07/2013 Elsevier Interactive Patient Education  2018 [employer] Prenatal Care WHAT IS PRENATAL CARE? Prenatal care is the process of caring for a pregnant woman before she gives birth. Prenatal care makes sure that she and her baby remain as healthy as possible throughout pregnancy. Prenatal care may be provided by a midwife, family practice health care provider, or a childbirth and pregnancy specialist (obstetrician). Prenatal care may include physical examinations, testing, treatments, and education on nutrition, lifestyle, and  social support services. WHY IS PRENATAL CARE SO IMPORTANT? Early and consistent prenatal care increases the chance that you and your baby will remain healthy throughout your pregnancy. This type of care also decreases a baby's risk of being born too early (prematurely), or being born smaller than expected (small for gestational age). Any underlying medical conditions you may have that could pose a risk during your pregnancy are discussed during prenatal care visits. You will also be monitored regularly for any new conditions that may arise during your pregnancy so they can be treated quickly and effectively. WHAT HAPPENS DURING PRENATAL CARE VISITS? Prenatal care visits may include the following: Discussion Tell your health care provider about any new signs or symptoms you have experienced since your last visit. These might include:  Nausea or vomiting.  Increased or decreased level of energy.  Difficulty sleeping.  Back or leg pain.  Weight changes.  Frequent urination.  Shortness of breath with physical activity.  Changes in your skin, such as the development of a rash or itchiness.  Vaginal discharge or bleeding.  Feelings of excitement or nervousness.  Changes in   your baby's movements.  You may want to write down any questions or topics you want to discuss with your health care provider and bring them with you to your appointment. Examination During your first prenatal care visit, you will likely have a complete physical exam. Your health care provider will often examine your vagina, cervix, and the position of your uterus, as well as check your heart, lungs, and other body systems. As your pregnancy progresses, your health care provider will measure the size of your uterus and your baby's position inside your uterus. He or she may also examine you for early signs of labor. Your prenatal visits may also include checking your blood pressure and, after about 10-12 weeks of  pregnancy, listening to your baby's heartbeat. Testing Regular testing often includes:  Urinalysis. This checks your urine for glucose, protein, or signs of infection.  Blood count. This checks the levels of white and red blood cells in your body.  Tests for sexually transmitted infections (STIs). Testing for STIs at the beginning of pregnancy is routinely done and is required in many states.  Antibody testing. You will be checked to see if you are immune to certain illnesses, such as rubella, that can affect a developing fetus.  Glucose screen. Around 24-28 weeks of pregnancy, your blood glucose level will be checked for signs of gestational diabetes. Follow-up tests may be recommended.  Group B strep. This is a bacteria that is commonly found inside a woman's vagina. This test will inform your health care provider if you need an antibiotic to reduce the amount of this bacteria in your body prior to labor and childbirth.  Ultrasound. Many pregnant women undergo an ultrasound screening around 18-20 weeks of pregnancy to evaluate the health of the fetus and check for any developmental abnormalities.  HIV (human immunodeficiency virus) testing. Early in your pregnancy, you will be screened for HIV. If you are at high risk for HIV, this test may be repeated during your third trimester of pregnancy.  You may be offered other testing based on your age, personal or family medical history, or other factors. HOW OFTEN SHOULD I PLAN TO SEE MY HEALTH CARE PROVIDER FOR PRENATAL CARE? Your prenatal care check-up schedule depends on any medical conditions you have before, or develop during, your pregnancy. If you do not have any underlying medical conditions, you will likely be seen for checkups:  Monthly, during the first 6 months of pregnancy.  Twice a month during months 7 and 8 of pregnancy.  Weekly starting in the 9th month of pregnancy and until delivery.  If you develop signs of early labor  or other concerning signs or symptoms, you may need to see your health care provider more often. Ask your health care provider what prenatal care schedule is best for you. WHAT CAN I DO TO KEEP MYSELF AND MY BABY AS HEALTHY AS POSSIBLE DURING MY PREGNANCY?  Take a prenatal vitamin containing 400 micrograms (0.4 mg) of folic acid every day. Your health care provider may also ask you to take additional vitamins such as iodine, vitamin D, iron, copper, and zinc.  Take 1500-2000 mg of calcium daily starting at your 20th week of pregnancy until you deliver your baby.  Make sure you are up to date on your vaccinations. Unless directed otherwise by your health care provider: ? You should receive a tetanus, diphtheria, and pertussis (Tdap) vaccination between the 27th and 36th week of your pregnancy, regardless of when your last Tdap immunization   occurred. This helps protect your baby from whooping cough (pertussis) after he or she is born. ? You should receive an annual inactivated influenza vaccine (IIV) to help protect you and your baby from influenza. This can be done at any point during your pregnancy.  Eat a well-rounded diet that includes: ? Fresh fruits and vegetables. ? Lean proteins. ? Calcium-rich foods such as milk, yogurt, hard cheeses, and dark, leafy greens. ? Whole grain breads.  Do noteat seafood high in mercury, including: ? Swordfish. ? Tilefish. ? Shark. ? King mackerel. ? More than 6 oz tuna per week.  Do not eat: ? Raw or undercooked meats or eggs. ? Unpasteurized foods, such as soft cheeses (brie, blue, or feta), juices, and milks. ? Lunch meats. ? Hot dogs that have not been heated until they are steaming.  Drink enough water to keep your urine clear or pale yellow. For many women, this may be 10 or more 8 oz glasses of water each day. Keeping yourself hydrated helps deliver nutrients to your baby and may prevent the start of pre-term uterine contractions.  Do not use  any tobacco products including cigarettes, chewing tobacco, or electronic cigarettes. If you need help quitting, ask your health care provider.  Do not drink beverages containing alcohol. No safe level of alcohol consumption during pregnancy has been determined.  Do not use any illegal drugs. These can harm your developing baby or cause a miscarriage.  Ask your health care provider or pharmacist before taking any prescription or over-the-counter medicines, herbs, or supplements.  Limit your caffeine intake to no more than 200 mg per day.  Exercise. Unless told otherwise by your health care provider, try to get 30 minutes of moderate exercise most days of the week. Do not  do high-impact activities, contact sports, or activities with a high risk of falling, such as horseback riding or downhill skiing.  Get plenty of rest.  Avoid anything that raises your body temperature, such as hot tubs and saunas.  If you own a cat, do not empty its litter box. Bacteria contained in cat feces can cause an infection called toxoplasmosis. This can result in serious harm to the fetus.  Stay away from chemicals such as insecticides, lead, mercury, and cleaning or paint products that contain solvents.  Do not have any X-rays taken unless medically necessary.  Take a childbirth and breastfeeding preparation class. Ask your health care provider if you need a referral or recommendation.  This information is not intended to replace advice given to you by your health care provider. Make sure you discuss any questions you have with your health care provider. Document Released: 12/26/2002 Document Revised: 05/28/2015 Document Reviewed: 03/09/2013 Else[employer] Interactive Patient Education  2017 [employer]  

## 2017-06-04 NOTE — Progress Notes (Signed)
NOB Left side pain Body rash Viability scan today

## 2017-06-05 LAB — URINE DRUG PANEL 7
Amphetamines, Urine: NEGATIVE ng/mL
Barbiturate Quant, Ur: NEGATIVE ng/mL
Benzodiazepine Quant, Ur: NEGATIVE ng/mL
Cannabinoid Quant, Ur: NEGATIVE ng/mL
Cocaine (Metab.): NEGATIVE ng/mL
Opiate Quant, Ur: NEGATIVE ng/mL
PCP Quant, Ur: NEGATIVE ng/mL

## 2017-06-06 LAB — CHLAMYDIA/GONOCOCCUS/TRICHOMONAS, NAA
Chlamydia by NAA: NEGATIVE
Gonococcus by NAA: NEGATIVE
Trich vag by NAA: NEGATIVE

## 2017-06-08 ENCOUNTER — Other Ambulatory Visit: Payer: Self-pay | Admitting: Advanced Practice Midwife

## 2017-06-08 DIAGNOSIS — O2341 Unspecified infection of urinary tract in pregnancy, first trimester: Secondary | ICD-10-CM

## 2017-06-08 LAB — URINE CULTURE

## 2017-06-08 MED ORDER — CEPHALEXIN 500 MG PO CAPS: 500.0000 mg | ORAL_CAPSULE | Freq: Two times a day (BID) | ORAL | 0 refills | Status: DC

## 2017-06-08 NOTE — Progress Notes (Signed)
Rx keflex sent to patient pharmacy.

## 2017-07-02 ENCOUNTER — Encounter: Payer: Self-pay | Admitting: Obstetrics and Gynecology

## 2017-07-02 ENCOUNTER — Ambulatory Visit (INDEPENDENT_AMBULATORY_CARE_PROVIDER_SITE_OTHER): Payer: Medicaid Other | Admitting: Obstetrics and Gynecology

## 2017-07-02 VITALS — BP 100/60 | Wt 158.0 lb

## 2017-07-02 DIAGNOSIS — Z3A11 11 weeks gestation of pregnancy: Secondary | ICD-10-CM

## 2017-07-02 DIAGNOSIS — O2341 Unspecified infection of urinary tract in pregnancy, first trimester: Secondary | ICD-10-CM

## 2017-07-02 DIAGNOSIS — Z3480 Encounter for supervision of other normal pregnancy, unspecified trimester: Secondary | ICD-10-CM

## 2017-07-02 MED ORDER — CEPHALEXIN 500 MG PO CAPS: 500.0000 mg | ORAL_CAPSULE | Freq: Two times a day (BID) | ORAL | 0 refills | Status: AC

## 2017-07-02 NOTE — Progress Notes (Signed)
    Routine Prenatal Care Visit  Subjective  Desiree Mosley is a 32 y.o. [phone] at 4834w6d being seen today for ongoing prenatal care.  She is currently monitored for the following issues for this low-risk pregnancy and has Supervision of other normal pregnancy, antepartum on their problem list.  ----------------------------------------------------------------------------------- Patient reports lower pelvic pain.   She has not taken the keflex for her UTI because she forgot about it. Contractions: Not present. Vag. Bleeding: None.  Movement: Absent. Denies leaking of fluid.  ----------------------------------------------------------------------------------- The following portions of the patient's history were reviewed and updated as appropriate: allergies, current medications, past family history, past medical history, past social history, past surgical history and problem list. Problem list updated.   Objective  Blood pressure 100/60, weight 158 lb (71.7 kg), last menstrual period 03/23/2017, unknown if currently breastfeeding. Pregravid weight 145 lb (65.8 kg) Total Weight Gain 13 lb (5.897 kg) Urinalysis:      Fetal Status: Fetal Heart Rate (bpm): 171   Movement: Absent     General:  Alert, oriented and cooperative. Patient is in no acute distress.  Skin: Skin is warm and dry. No rash noted.   Cardiovascular: Normal heart rate noted  Respiratory: Normal respiratory effort, no problems with respiration noted  Abdomen: Soft, gravid, appropriate for gestational age. Pain/Pressure: Present     Pelvic:  Cervical exam deferred        Extremities: Normal range of motion.  Edema: None  ental Status: Normal mood and affect. Normal behavior. Normal judgment and thought content.     Assessment   31 y.o. [id number] at 3934w6d by  01/15/2018, by Ultrasound presenting for routine prenatal visit  Plan   Pregnancy #5 Problems (from 03/19/17 to present)    Problem Noted Resolved   Supervision of  other normal pregnancy, antepartum 06/04/2017 by Tresea MallGledhill, Jane, CNM No   Overview Addendum 07/02/2017 10:30 AM by Natale MilchSchuman, Christanna R, MD    [facility]inic [facility] Prenatal Labs  Dating 6 week US Blood type:     Genetic Screen 1 Screen:    AFP:     Quad:     NIPS: Antibody:   Anatomic US  Rubella:   Varicella: @VZVIGG @  GTT Early:               Third trimester:  RPR:     Rhogam  HBsAg:     TDaP vaccine                       Flu Shot: HIV:     Baby Food Formula                               GBS:   Contraception  Pap:  CBB     CS/VBAC NA   Support Person Husband Desiree Mosley               Gestational age appropriate obstetric precautions including but not limited to vaginal bleeding, contractions, leaking of fluid and fetal movement were reviewed in detail with the patient.    NOB panel today. Resent the prescription for her urinary tract infection.  Given information on prenatal classes with [facility].  Given information on volunteer doula program at the hospital.   Return in about 1 month (around 07/30/2017).  Adelene Idlerhristanna Schuman MD [facility], [facility] 07/02/17 10:43 AM

## 2017-07-02 NOTE — Progress Notes (Signed)
Pt c/o pain in lower abdomen, unsure if constipation. Unable to give urine sample.

## 2017-07-03 LAB — RPR+RH+ABO+RUB AB+AB SCR+CB...
Antibody Screen: NEGATIVE
HIV Screen 4th Generation wRfx: NONREACTIVE
Hematocrit: 36.8 % (ref 34.0–46.6)
Hemoglobin: 11.9 g/dL (ref 11.1–15.9)
Hepatitis B Surface Ag: NEGATIVE
MCH: 30.7 pg (ref 26.6–33.0)
MCHC: 32.3 g/dL (ref 31.5–35.7)
MCV: 95 fL (ref 79–97)
Platelets: 255 10*3/uL (ref 150–450)
RBC: 3.87 x10E6/uL (ref 3.77–5.28)
RDW: 14.1 % (ref 12.3–15.4)
RPR Ser Ql: NONREACTIVE
Rh Factor: POSITIVE
Rubella Antibodies, IGG: 1.37 index (ref >0.99)
Varicella zoster IgG: 966 index (ref >165)
WBC: 6.9 10*3/uL (ref 3.4–10.8)

## 2017-07-03 NOTE — Progress Notes (Signed)
Can you please call patient and let her know that her results are normal? Thank you, Dr. Jerene PitchSchuman

## 2017-07-27 ENCOUNTER — Encounter: Payer: Medicaid Other | Admitting: Maternal Newborn

## 2017-07-28 ENCOUNTER — Encounter: Payer: Self-pay | Admitting: Maternal Newborn

## 2017-07-28 ENCOUNTER — Ambulatory Visit (INDEPENDENT_AMBULATORY_CARE_PROVIDER_SITE_OTHER): Payer: Medicaid Other | Admitting: Maternal Newborn

## 2017-07-28 VITALS — BP 90/60 | Wt 156.2 lb

## 2017-07-28 DIAGNOSIS — Z3A15 15 weeks gestation of pregnancy: Secondary | ICD-10-CM

## 2017-07-28 DIAGNOSIS — Z3689 Encounter for other specified antenatal screening: Secondary | ICD-10-CM

## 2017-07-28 DIAGNOSIS — Z3482 Encounter for supervision of other normal pregnancy, second trimester: Secondary | ICD-10-CM

## 2017-07-28 DIAGNOSIS — Z3480 Encounter for supervision of other normal pregnancy, unspecified trimester: Secondary | ICD-10-CM

## 2017-07-28 NOTE — Progress Notes (Signed)
No concerns.rj 

## 2017-07-28 NOTE — Progress Notes (Signed)
    Routine Prenatal Care Visit  Subjective  Desiree Mosley is a 32 y.o. [phone] at 4757w4d being seen today for ongoing prenatal care.  She is currently monitored for the following issues for this low-risk pregnancy and has Supervision of other normal pregnancy, antepartum on their problem list.  ----------------------------------------------------------------------------------- Patient reports no complaints.   Contractions: Not present. Vag. Bleeding: None.  Movement: Present. ----------------------------------------------------------------------------------- The following portions of the patient's history were reviewed and updated as appropriate: allergies, current medications, past family history, past medical history, past social history, past surgical history and problem list. Problem list updated.   Objective  Blood pressure 90/60, weight 156 lb 4 oz (70.9 kg), last menstrual period 03/23/2017. Pregravid weight 145 lb (65.8 kg) Total Weight Gain 11 lb 4 oz (5.103 kg)   Urinalysis:      Fetal Status: Fetal Heart Rate (bpm): 154   Movement: Present     General:  Alert, oriented and cooperative. Patient is in no acute distress.  Skin: Skin is warm and dry. No rash noted.   Cardiovascular: Normal heart rate noted  Respiratory: Normal respiratory effort, no problems with respiration noted  Abdomen: Soft, gravid, appropriate for gestational age. Pain/Pressure: Absent     Pelvic:  Cervical exam deferred        Extremities: Normal range of motion.  Edema: None  Mental Status: Normal mood and affect. Normal behavior. Normal judgment and thought content.    Assessment   31 y.o. [id number] at 6757w4d, EDD 01/15/2018 by Ultrasound presenting for routine prenatal visit.  Plan   Pregnancy #5 Problems (from 03/19/17 to present)    Problem Noted Resolved   Supervision of other normal pregnancy, antepartum 06/04/2017 by Desiree Mosley, Desiree, CNM No   Overview Addendum 07/02/2017 10:30 AM by Desiree Mosley,  Desiree R, MD    [facility]inic [facility] Prenatal Labs  Dating 6 week US Blood type:     Genetic Screen 1 Screen:    AFP:     Quad:     NIPS: Antibody:   Anatomic US  Rubella:   Varicella: @VZVIGG @  GTT Early:               Third trimester:  RPR:     Rhogam  HBsAg:     TDaP vaccine                       Flu Shot: HIV:     Baby Food Formula                               GBS:   Contraception  Pap:  CBB     CS/VBAC NA   Support Person Husband Desiree Mosley              Gestational age appropriate obstetric precautions were reviewed.  Return in about 1 month (around 08/25/2017) for ROB and anatomy scan.  Desiree Mosley, CNM 07/28/2017  1:22 PM

## 2017-08-26 ENCOUNTER — Ambulatory Visit (INDEPENDENT_AMBULATORY_CARE_PROVIDER_SITE_OTHER): Payer: Medicaid Other | Admitting: Advanced Practice Midwife

## 2017-08-26 ENCOUNTER — Encounter: Payer: Self-pay | Admitting: Advanced Practice Midwife

## 2017-08-26 ENCOUNTER — Ambulatory Visit (INDEPENDENT_AMBULATORY_CARE_PROVIDER_SITE_OTHER): Payer: Medicaid Other

## 2017-08-26 VITALS — BP 92/58 | Wt 162.0 lb

## 2017-08-26 DIAGNOSIS — Z3A19 19 weeks gestation of pregnancy: Secondary | ICD-10-CM

## 2017-08-26 DIAGNOSIS — Z3689 Encounter for other specified antenatal screening: Secondary | ICD-10-CM

## 2017-08-26 DIAGNOSIS — Z3480 Encounter for supervision of other normal pregnancy, unspecified trimester: Secondary | ICD-10-CM

## 2017-08-26 DIAGNOSIS — Z363 Encounter for antenatal screening for malformations: Secondary | ICD-10-CM

## 2017-08-26 DIAGNOSIS — Z3482 Encounter for supervision of other normal pregnancy, second trimester: Secondary | ICD-10-CM

## 2017-08-26 LAB — POCT URINALYSIS DIPSTICK OB
Glucose, UA: NEGATIVE — AB
POC,PROTEIN,UA: NEGATIVE

## 2017-08-26 NOTE — Addendum Note (Signed)
Addended by: Garfield CorneaMABRY, JASMINE L on: 08/26/2017 09:28 AM   Modules accepted: Orders

## 2017-08-26 NOTE — Progress Notes (Signed)
  Routine Prenatal Care Visit  Subjective  Desiree Mosley is a 32 y.o. [phone] at 5264w5d being seen today for ongoing prenatal care.  She is currently monitored for the following issues for this low-risk pregnancy and has Supervision of other normal pregnancy, antepartum on their problem list.  ----------------------------------------------------------------------------------- Patient reports no complaints.   Contractions: Not present. Vag. Bleeding: None.  Movement: Present. Denies leaking of fluid.  ----------------------------------------------------------------------------------- The following portions of the patient's history were reviewed and updated as appropriate: allergies, current medications, past family history, past medical history, past social history, past surgical history and problem list. Problem list updated.   Objective  Blood pressure (!) 92/58, weight 162 lb (73.5 kg), last menstrual period 03/23/2017, unknown if currently breastfeeding. Pregravid weight 145 lb (65.8 kg) Total Weight Gain 17 lb (7.711 kg) Urinalysis:      Fetal Status: Fetal Heart Rate (bpm): 147   Movement: Present     Anatomy scan today: complete and normal, cephalic, female  General:  Alert, oriented and cooperative. Patient is in no acute distress.  Skin: Skin is warm and dry. No rash noted.   Cardiovascular: Normal heart rate noted  Respiratory: Normal respiratory effort, no problems with respiration noted  Abdomen: Soft, gravid, appropriate for gestational age. Pain/Pressure: Absent     Pelvic:  Cervical exam deferred        Extremities: Normal range of motion.  Edema: None  Mental Status: Normal mood and affect. Normal behavior. Normal judgment and thought content.   Assessment   32 y.o. [id number] at 5264w5d by  01/15/2018, by Ultrasound presenting for routine prenatal visit  Plan   Pregnancy #5 Problems (from 03/19/17 to present)    Problem Noted Resolved   Supervision of other normal  pregnancy, antepartum 06/04/2017 by Tresea MallGledhill, Maybelline Kolarik, CNM No   Overview Addendum 07/28/2017 11:07 AM by Oswaldo ConroySchmid, Jacelyn Y, CNM    [facility]inic [facility] Prenatal Labs  Dating 6 week US Blood type: O/Positive/-- (06/27 1045)   Genetic Screen 1 Screen:    AFP:     Quad:     NIPS: Antibody:Negative (06/27 1045)  Anatomic US  Rubella: 1.37 (06/27 1045) Varicella: Immune  GTT Early:               Third trimester:  RPR: Non Reactive (06/27 1045)   Rhogam  HBsAg: Negative (06/27 1045)   TDaP vaccine                       Flu Shot: HIV: Non Reactive (06/27 1045)   Baby Food Formula                               GBS:   Contraception  Pap: 12/27/2015, LSIL  CBB     CS/VBAC NA   Support Person Husband Tashawn               Preterm labor symptoms and general obstetric precautions including but not limited to vaginal bleeding, contractions, leaking of fluid and fetal movement were reviewed in detail with the patient.   Return in about 4 weeks (around 09/23/2017) for rob.  Tresea MallJane Karsen Mosley, CNM 08/26/2017 9:13 AM

## 2017-08-26 NOTE — Progress Notes (Signed)
ROB- Declines flu shot 

## 2017-09-23 ENCOUNTER — Encounter: Payer: Medicaid Other | Admitting: Maternal Newborn

## 2017-09-25 ENCOUNTER — Ambulatory Visit (INDEPENDENT_AMBULATORY_CARE_PROVIDER_SITE_OTHER): Payer: Medicaid Other | Admitting: Advanced Practice Midwife

## 2017-09-25 ENCOUNTER — Encounter: Payer: Self-pay | Admitting: Advanced Practice Midwife

## 2017-09-25 VITALS — BP 106/60 | Wt 173.0 lb

## 2017-09-25 DIAGNOSIS — Z131 Encounter for screening for diabetes mellitus: Secondary | ICD-10-CM

## 2017-09-25 DIAGNOSIS — Z130 Encounter for screening for diseases of the blood and blood-forming organs and certain disorders involving the immune mechanism: Secondary | ICD-10-CM

## 2017-09-25 DIAGNOSIS — Z113 Encounter for screening for infections with a predominantly sexual mode of transmission: Secondary | ICD-10-CM

## 2017-09-25 DIAGNOSIS — Z3A24 24 weeks gestation of pregnancy: Secondary | ICD-10-CM

## 2017-09-25 DIAGNOSIS — Z3482 Encounter for supervision of other normal pregnancy, second trimester: Secondary | ICD-10-CM

## 2017-09-25 DIAGNOSIS — Z3480 Encounter for supervision of other normal pregnancy, unspecified trimester: Secondary | ICD-10-CM

## 2017-09-25 NOTE — Progress Notes (Signed)
ROB- no concerns 

## 2017-09-25 NOTE — Progress Notes (Signed)
  Routine Prenatal Care Visit  Subjective  Desiree Mosley is a 32 y.o. [phone] at 5331w0d being seen today for ongoing prenatal care.  She is currently monitored for the following issues for this low-risk pregnancy and has Supervision of other normal pregnancy, antepartum on their problem list.  ----------------------------------------------------------------------------------- Patient reports 13 pound weight gain since last visit. Encouraged healthy foods and increased activity.   Contractions: Not present. Vag. Bleeding: None.  Movement: Present. Denies leaking of fluid.  ----------------------------------------------------------------------------------- The following portions of the patient's history were reviewed and updated as appropriate: allergies, current medications, past family history, past medical history, past social history, past surgical history and problem list. Problem list updated.   Objective  Blood pressure 106/60, weight 173 lb (78.5 kg), last menstrual period 03/23/2017 Pregravid weight 145 lb (65.8 kg) Total Weight Gain 28 lb (12.7 kg) Urinalysis: Urine Protein    Urine Glucose    Fetal Status: Fetal Heart Rate (bpm): 156 Fundal Height: 25 cm Movement: Present     General:  Alert, oriented and cooperative. Patient is in no acute distress.  Skin: Skin is warm and dry. No rash noted.   Cardiovascular: Normal heart rate noted  Respiratory: Normal respiratory effort, no problems with respiration noted  Abdomen: Soft, gravid, appropriate for gestational age. Pain/Pressure: Absent     Pelvic:  Cervical exam deferred        Extremities: Normal range of motion.  Edema: None  Mental Status: Normal mood and affect. Normal behavior. Normal judgment and thought content.   Assessment   32 y.o. [id number] at 4331w0d by  01/15/2018, by Ultrasound presenting for routine prenatal visit  Plan   Pregnancy #5 Problems (from 03/19/17 to present)    Problem Noted Resolved   Supervision of other normal pregnancy, antepartum 06/04/2017 by Desiree MallGledhill, Genevia Bouldin, CNM No   Overview Addendum 07/28/2017 11:07 AM by Oswaldo ConroySchmid, Jacelyn Y, CNM    [facility]inic [facility] Prenatal Labs  Dating 6 week US Blood type: O/Positive/-- (06/27 1045)   Genetic Screen 1 Screen:    AFP:     Quad:     NIPS: Antibody:Negative (06/27 1045)  Anatomic US  Rubella: 1.37 (06/27 1045) Varicella: Immune  GTT Early:               Third trimester:  RPR: Non Reactive (06/27 1045)   Rhogam  HBsAg: Negative (06/27 1045)   TDaP vaccine                       Flu Shot: HIV: Non Reactive (06/27 1045)   Baby Food Formula                               GBS:   Contraception  Pap: 12/27/2015, LSIL  CBB     CS/VBAC NA   Support Person Husband Tashawn               Preterm labor symptoms and general obstetric precautions including but not limited to vaginal bleeding, contractions, leaking of fluid and fetal movement were reviewed in detail with the patient.   Return in about 4 weeks (around 10/23/2017) for 28 wk labs and rob.  Desiree Mosley, CNM 09/25/2017 11:40 AM

## 2017-10-23 ENCOUNTER — Ambulatory Visit (INDEPENDENT_AMBULATORY_CARE_PROVIDER_SITE_OTHER): Payer: Medicaid Other | Admitting: Obstetrics and Gynecology

## 2017-10-23 ENCOUNTER — Other Ambulatory Visit: Payer: Medicaid Other

## 2017-10-23 ENCOUNTER — Encounter: Payer: Self-pay | Admitting: Obstetrics and Gynecology

## 2017-10-23 VITALS — BP 118/74 | Wt 182.0 lb

## 2017-10-23 DIAGNOSIS — Z131 Encounter for screening for diabetes mellitus: Secondary | ICD-10-CM

## 2017-10-23 DIAGNOSIS — Z3480 Encounter for supervision of other normal pregnancy, unspecified trimester: Secondary | ICD-10-CM

## 2017-10-23 DIAGNOSIS — Z113 Encounter for screening for infections with a predominantly sexual mode of transmission: Secondary | ICD-10-CM

## 2017-10-23 DIAGNOSIS — Z3A28 28 weeks gestation of pregnancy: Secondary | ICD-10-CM

## 2017-10-23 DIAGNOSIS — Z130 Encounter for screening for diseases of the blood and blood-forming organs and certain disorders involving the immune mechanism: Secondary | ICD-10-CM

## 2017-10-23 DIAGNOSIS — Z3483 Encounter for supervision of other normal pregnancy, third trimester: Secondary | ICD-10-CM

## 2017-10-23 NOTE — Progress Notes (Signed)
  Routine Prenatal Care Visit  Subjective  Desiree Mosley is a 32 y.o. [phone] at [redacted]w[redacted]d being seen today for ongoing prenatal care.  She is currently monitored for the following issues for this low-risk pregnancy and has Supervision of other normal pregnancy, antepartum on their problem list.  ----------------------------------------------------------------------------------- Patient reports no complaints.   Contractions: Not present. Vag. Bleeding: None.  Movement: Present. Denies leaking of fluid.  ----------------------------------------------------------------------------------- The following portions of the patient's history were reviewed and updated as appropriate: allergies, current medications, past family history, past medical history, past social history, past surgical history and problem list. Problem list updated.   Objective  Blood pressure 118/74, weight 182 lb (82.6 kg), last menstrual period 03/23/2017, unknown if currently breastfeeding. Pregravid weight 145 lb (65.8 kg) Total Weight Gain 37 lb (16.8 kg) Urinalysis: Urine Protein    Urine Glucose    Fetal Status: Fetal Heart Rate (bpm): 150 Fundal Height: 29 cm Movement: Present     General:  Alert, oriented and cooperative. Patient is in no acute distress.  Skin: Skin is warm and dry. No rash noted.   Cardiovascular: Normal heart rate noted  Respiratory: Normal respiratory effort, no problems with respiration noted  Abdomen: Soft, gravid, appropriate for gestational age. Pain/Pressure: Absent     Pelvic:  Cervical exam deferred        Extremities: Normal range of motion.  Edema: None  Mental Status: Normal mood and affect. Normal behavior. Normal judgment and thought content.   Assessment   32 y.o. [id number] at [redacted]w[redacted]d by  01/15/2018, by Ultrasound presenting for routine prenatal visit  Plan   Pregnancy #5 Problems (from 03/19/17 to present)    Problem Noted Resolved   Supervision of other normal pregnancy,  antepartum 06/04/2017 by Tresea Mall, CNM No   Overview Addendum 07/28/2017 11:07 AM by Oswaldo Conroy, CNM    [facility]inic [facility] Prenatal Labs  Dating 6 week [place] Blood type: O/Positive/-- (06/27 1045)   Genetic Screen 1 Screen:    AFP:     Quad:     NIPS: Antibody:Negative (06/27 1045)  Anatomic [place]  Rubella: 1.37 (06/27 1045) Varicella: Immune  GTT Early:               Third trimester:  RPR: Non Reactive (06/27 1045)   Rhogam  HBsAg: Negative (06/27 1045)   TDaP vaccine                       Flu Shot: HIV: Non Reactive (06/27 1045)   Baby Food Formula                               GBS:   Contraception  Pap: 12/27/2015, LSIL  CBB     CS/VBAC NA   Support Person Husband Tashawn               Preterm labor symptoms and general obstetric precautions including but not limited to vaginal bleeding, contractions, leaking of fluid and fetal movement were reviewed in detail with the patient. Please refer to After Visit Summary for other counseling recommendations.   - 28 wk labs today.  Return in about 2 weeks (around 11/06/2017) for Routine Prenatal Appointment.  Thomasene Mohair, MD, [facility], [facility] 10/23/2017 9:37 AM

## 2017-10-24 LAB — 28 WEEK RH+PANEL
Basophils Absolute: 0 10*3/uL (ref 0.0–0.2)
Basos: 0 %
EOS (ABSOLUTE): 0.1 10*3/uL (ref 0.0–0.4)
Eos: 1 %
Gestational Diabetes Screen: 128 mg/dL (ref 65–139)
HIV Screen 4th Generation wRfx: NONREACTIVE
Hematocrit: 29.9 % — ABNORMAL LOW (ref 34.0–46.6)
Hemoglobin: 10 g/dL — ABNORMAL LOW (ref 11.1–15.9)
Immature Grans (Abs): 0.1 10*3/uL (ref 0.0–0.1)
Immature Granulocytes: 1 %
Lymphocytes Absolute: 1 10*3/uL (ref 0.7–3.1)
Lymphs: 13 %
MCH: 30.5 pg (ref 26.6–33.0)
MCHC: 33.4 g/dL (ref 31.5–35.7)
MCV: 91 fL (ref 79–97)
Monocytes Absolute: 0.5 10*3/uL (ref 0.1–0.9)
Monocytes: 7 %
Neutrophils Absolute: 6.1 10*3/uL (ref 1.4–7.0)
Neutrophils: 78 %
Platelets: 172 10*3/uL (ref 150–450)
RBC: 3.28 x10E6/uL — ABNORMAL LOW (ref 3.77–5.28)
RDW: 12.9 % (ref 12.3–15.4)
RPR Ser Ql: NONREACTIVE
WBC: 7.9 10*3/uL (ref 3.4–10.8)

## 2017-11-06 ENCOUNTER — Ambulatory Visit (INDEPENDENT_AMBULATORY_CARE_PROVIDER_SITE_OTHER): Payer: Medicaid Other | Admitting: Maternal Newborn

## 2017-11-06 ENCOUNTER — Encounter: Payer: Self-pay | Admitting: Maternal Newborn

## 2017-11-06 VITALS — BP 100/60 | Wt 181.5 lb

## 2017-11-06 DIAGNOSIS — Z3A30 30 weeks gestation of pregnancy: Secondary | ICD-10-CM

## 2017-11-06 DIAGNOSIS — Z3480 Encounter for supervision of other normal pregnancy, unspecified trimester: Secondary | ICD-10-CM

## 2017-11-06 DIAGNOSIS — O99013 Anemia complicating pregnancy, third trimester: Secondary | ICD-10-CM

## 2017-11-06 LAB — POCT URINALYSIS DIPSTICK OB

## 2017-11-06 NOTE — Progress Notes (Signed)
    Routine Prenatal Care Visit  Subjective  Desiree Mosley is a 32 y.o. [phone] at [redacted]w[redacted]d being seen today for ongoing prenatal care.  She is currently monitored for the following issues for this low-risk pregnancy and has Supervision of other normal pregnancy, antepartum on their problem list.  ----------------------------------------------------------------------------------- Patient reports pain and stiffness in her right leg when she sleeps.   Contractions: Not present. Vag. Bleeding: None.  Movement: Present. No leaking of fluid.  ----------------------------------------------------------------------------------- The following portions of the patient's history were reviewed and updated as appropriate: allergies, current medications, past family history, past medical history, past social history, past surgical history and problem list. Problem list updated.  Objective  Blood pressure 100/60, weight 181 lb 8 oz (82.3 kg), last menstrual period 03/23/2017. Pregravid weight 145 lb (65.8 kg) Total Weight Gain 36 lb 8 oz (16.6 kg) Body mass index is 29.29 kg/m.   Urinalysis: No sample today Fetal Status: Fetal Heart Rate (bpm): 145 Fundal Height: 30 cm Movement: Present     General:  Alert, oriented and cooperative. Patient is in no acute distress.  Skin: Skin is warm and dry. No rash noted.   Cardiovascular: Normal heart rate noted  Respiratory: Normal respiratory effort, no problems with respiration noted  Abdomen: Soft, gravid, appropriate for gestational age. Pain/Pressure: Absent     Pelvic:  Cervical exam deferred        Extremities: Normal range of motion.  Edema: None  Mental Status: Normal mood and affect. Normal behavior. Normal judgment and thought content.    Assessment   32 y.o. [id number] at [redacted]w[redacted]d, EDD 01/15/2018 by Ultrasound presenting for a routine prenatal visit.  Plan   Pregnancy #5 Problems (from 03/19/17 to present)    Problem Noted Resolved   Supervision of  other normal pregnancy, antepartum 06/04/2017 by Tresea Mall, CNM No   Overview Addendum 07/28/2017 11:07 AM by Oswaldo Conroy, CNM    [facility]inic [facility] Prenatal Labs  Dating 6 week [place] Blood type: O/Positive/-- (06/27 1045)   Genetic Screen 1 Screen:    AFP:     Quad:     NIPS: Antibody:Negative (06/27 1045)  Anatomic [place]  Rubella: 1.37 (06/27 1045) Varicella: Immune  GTT Early:               Third trimester:  RPR: Non Reactive (06/27 1045)   Rhogam  HBsAg: Negative (06/27 1045)   TDaP vaccine                       Flu Shot: HIV: Non Reactive (06/27 1045)   Baby Food Formula                               GBS:   Contraception  Pap: 12/27/2015, LSIL  CBB     CS/VBAC NA   Support Person Husband Toribio Harbour            Declines TDaP today.  Gestational age appropriate obstetric precautions were reviewed..   Return in about 2 weeks (around 11/20/2017) for ROB.  Marcelyn Bruins, CNM 11/06/2017  3:46 PM

## 2017-11-06 NOTE — Progress Notes (Signed)
No concerns.rj 

## 2017-11-20 ENCOUNTER — Encounter: Payer: Medicaid Other | Admitting: Maternal Newborn

## 2017-11-23 ENCOUNTER — Encounter: Payer: Self-pay | Admitting: Advanced Practice Midwife

## 2017-11-23 ENCOUNTER — Ambulatory Visit (INDEPENDENT_AMBULATORY_CARE_PROVIDER_SITE_OTHER): Payer: Medicaid Other | Admitting: Advanced Practice Midwife

## 2017-11-23 VITALS — BP 104/62 | Wt 186.0 lb

## 2017-11-23 DIAGNOSIS — Z3483 Encounter for supervision of other normal pregnancy, third trimester: Secondary | ICD-10-CM

## 2017-11-23 DIAGNOSIS — Z3A32 32 weeks gestation of pregnancy: Secondary | ICD-10-CM

## 2017-11-23 LAB — POCT URINALYSIS DIPSTICK OB: Glucose, UA: NEGATIVE

## 2017-11-23 NOTE — Patient Instructions (Signed)
Third Trimester of Pregnancy The third trimester is from week 28 through week 40 (months 7 through 9). The third trimester is a time when the unborn baby (fetus) is growing rapidly. At the end of the ninth month, the fetus is about 20 inches in length and weighs 6-10 pounds. Body changes during your third trimester Your body will continue to go through many changes during pregnancy. The changes vary from woman to woman. During the third trimester:  Your weight will continue to increase. You can expect to gain 25-35 pounds (11-16 kg) by the end of the pregnancy.  You may begin to get stretch marks on your hips, abdomen, and breasts.  You may urinate more often because the fetus is moving lower into your pelvis and pressing on your bladder.  You may develop or continue to have heartburn. This is caused by increased hormones that slow down muscles in the digestive tract.  You may develop or continue to have constipation because increased hormones slow digestion and cause the muscles that push waste through your intestines to relax.  You may develop hemorrhoids. These are swollen veins (varicose veins) in the rectum that can itch or be painful.  You may develop swollen, bulging veins (varicose veins) in your legs.  You may have increased body aches in the pelvis, back, or thighs. This is due to weight gain and increased hormones that are relaxing your joints.  You may have changes in your hair. These can include thickening of your hair, rapid growth, and changes in texture. Some women also have hair loss during or after pregnancy, or hair that feels dry or thin. Your hair will most likely return to normal after your baby is born.  Your breasts will continue to grow and they will continue to become tender. A yellow fluid (colostrum) may leak from your breasts. This is the first milk you are producing for your baby.  Your belly button may stick out.  You may notice more swelling in your hands,  face, or ankles.  You may have increased tingling or numbness in your hands, arms, and legs. The skin on your belly may also feel numb.  You may feel short of breath because of your expanding uterus.  You may have more problems sleeping. This can be caused by the size of your belly, increased need to urinate, and an increase in your body's metabolism.  You may notice the fetus "dropping," or moving lower in your abdomen (lightening).  You may have increased vaginal discharge.  You may notice your joints feel loose and you may have pain around your pelvic bone.  What to expect at prenatal visits You will have prenatal exams every 2 weeks until week 36. Then you will have weekly prenatal exams. During a routine prenatal visit:  You will be weighed to make sure you and the baby are growing normally.  Your blood pressure will be taken.  Your abdomen will be measured to track your baby's growth.  The fetal heartbeat will be listened to.  Any test results from the previous visit will be discussed.  You may have a cervical check near your due date to see if your cervix has softened or thinned (effaced).  You will be tested for Group B streptococcus. This happens between 35 and 37 weeks.  Your health care provider may ask you:  What your birth plan is.  How you are feeling.  If you are feeling the baby move.  If you have had   any abnormal symptoms, such as leaking fluid, bleeding, severe headaches, or abdominal cramping.  If you are using any tobacco products, including cigarettes, chewing tobacco, and electronic cigarettes.  If you have any questions.  Other tests or screenings that may be performed during your third trimester include:  Blood tests that check for low iron levels (anemia).  Fetal testing to check the health, activity level, and growth of the fetus. Testing is done if you have certain medical conditions or if there are problems during the  pregnancy.  Nonstress test (NST). This test checks the health of your baby to make sure there are no signs of problems, such as the baby not getting enough oxygen. During this test, a belt is placed around your belly. The baby is made to move, and its heart rate is monitored during movement.  What is false labor? False labor is a condition in which you feel small, irregular tightenings of the muscles in the womb (contractions) that usually go away with rest, changing position, or drinking water. These are called Braxton Hicks contractions. Contractions may last for hours, days, or even weeks before true labor sets in. If contractions come at regular intervals, become more frequent, increase in intensity, or become painful, you should see your health care provider. What are the signs of labor?  Abdominal cramps.  Regular contractions that start at 10 minutes apart and become stronger and more frequent with time.  Contractions that start on the top of the uterus and spread down to the lower abdomen and back.  Increased pelvic pressure and dull back pain.  A watery or bloody mucus discharge that comes from the vagina.  Leaking of amniotic fluid. This is also known as your "water breaking." It could be a slow trickle or a gush. Let your health care provider know if it has a color or strange odor. If you have any of these signs, call your health care provider right away, even if it is before your due date. Follow these instructions at home: Medicines  Follow your health care provider's instructions regarding medicine use. Specific medicines may be either safe or unsafe to take during pregnancy.  Take a prenatal vitamin that contains at least 600 micrograms (mcg) of folic acid.  If you develop constipation, try taking a stool softener if your health care provider approves. Eating and drinking  Eat a balanced diet that includes fresh fruits and vegetables, whole grains, good sources of protein  such as meat, eggs, or tofu, and low-fat dairy. Your health care provider will help you determine the amount of weight gain that is right for you.  Avoid raw meat and uncooked cheese. These carry germs that can cause birth defects in the baby.  If you have low calcium intake from food, talk to your health care provider about whether you should take a daily calcium supplement.  Eat four or five small meals rather than three large meals a day.  Limit foods that are high in fat and processed sugars, such as fried and sweet foods.  To prevent constipation: ? Drink enough fluid to keep your urine clear or pale yellow. ? Eat foods that are high in fiber, such as fresh fruits and vegetables, whole grains, and beans. Activity  Exercise only as directed by your health care provider. Most women can continue their usual exercise routine during pregnancy. Try to exercise for 30 minutes at least 5 days a week. Stop exercising if you experience uterine contractions.  Avoid heavy   lifting.  Do not exercise in extreme heat or humidity, or at high altitudes.  Wear low-heel, comfortable shoes.  Practice good posture.  You may continue to have sex unless your health care provider tells you otherwise. Relieving pain and discomfort  Take frequent breaks and rest with your legs elevated if you have leg cramps or low back pain.  Take warm sitz baths to soothe any pain or discomfort caused by hemorrhoids. Use hemorrhoid cream if your health care provider approves.  Wear a good support bra to prevent discomfort from breast tenderness.  If you develop varicose veins: ? Wear support pantyhose or compression stockings as told by your healthcare provider. ? Elevate your feet for 15 minutes, 3-4 times a day. Prenatal care  Write down your questions. Take them to your prenatal visits.  Keep all your prenatal visits as told by your health care provider. This is important. Safety  Wear your seat belt at  all times when driving.  Make a list of emergency phone numbers, including numbers for family, friends, the hospital, and police and fire departments. General instructions  Avoid cat litter boxes and soil used by cats. These carry germs that can cause birth defects in the baby. If you have a cat, ask someone to clean the litter box for you.  Do not travel far distances unless it is absolutely necessary and only with the approval of your health care provider.  Do not use hot tubs, steam rooms, or saunas.  Do not drink alcohol.  Do not use any products that contain nicotine or tobacco, such as cigarettes and e-cigarettes. If you need help quitting, ask your health care provider.  Do not use any medicinal herbs or unprescribed drugs. These chemicals affect the formation and growth of the baby.  Do not douche or use tampons or scented sanitary pads.  Do not cross your legs for long periods of time.  To prepare for the arrival of your baby: ? Take prenatal classes to understand, practice, and ask questions about labor and delivery. ? Make a trial run to the hospital. ? Visit the hospital and tour the maternity area. ? Arrange for maternity or paternity leave through employers. ? Arrange for family and friends to take care of pets while you are in the hospital. ? Purchase a rear-facing car seat and make sure you know how to install it in your car. ? Pack your hospital bag. ? Prepare the baby's nursery. Make sure to remove all pillows and stuffed animals from the baby's crib to prevent suffocation.  Visit your dentist if you have not gone during your pregnancy. Use a soft toothbrush to brush your teeth and be gentle when you floss. Contact a health care provider if:  You are unsure if you are in labor or if your water has broken.  You become dizzy.  You have mild pelvic cramps, pelvic pressure, or nagging pain in your abdominal area.  You have lower back pain.  You have persistent  nausea, vomiting, or diarrhea.  You have an unusual or bad smelling vaginal discharge.  You have pain when you urinate. Get help right away if:  Your water breaks before 37 weeks.  You have regular contractions less than 5 minutes apart before 37 weeks.  You have a fever.  You are leaking fluid from your vagina.  You have spotting or bleeding from your vagina.  You have severe abdominal pain or cramping.  You have rapid weight loss or weight gain.    You have shortness of breath with chest pain.  You notice sudden or extreme swelling of your face, hands, ankles, feet, or legs.  Your baby makes fewer than 10 movements in 2 hours.  You have severe headaches that do not go away when you take medicine.  You have vision changes. Summary  The third trimester is from week 28 through week 40, months 7 through 9. The third trimester is a time when the unborn baby (fetus) is growing rapidly.  During the third trimester, your discomfort may increase as you and your baby continue to gain weight. You may have abdominal, leg, and back pain, sleeping problems, and an increased need to urinate.  During the third trimester your breasts will keep growing and they will continue to become tender. A yellow fluid (colostrum) may leak from your breasts. This is the first milk you are producing for your baby.  False labor is a condition in which you feel small, irregular tightenings of the muscles in the womb (contractions) that eventually go away. These are called Braxton Hicks contractions. Contractions may last for hours, days, or even weeks before true labor sets in.  Signs of labor can include: abdominal cramps; regular contractions that start at 10 minutes apart and become stronger and more frequent with time; watery or bloody mucus discharge that comes from the vagina; increased pelvic pressure and dull back pain; and leaking of amniotic fluid. This information is not intended to replace advice  given to you by your health care provider. Make sure you discuss any questions you have with your health care provider. Document Released: 12/17/2000 Document Revised: 05/31/2015 Document Reviewed: 02/24/2012 Elsevier Interactive Patient Education  2017 [employer]  

## 2017-11-23 NOTE — Progress Notes (Signed)
ROB Numbness in hands

## 2017-11-23 NOTE — Progress Notes (Signed)
  Routine Prenatal Care Visit  Subjective  Desiree Mosley is a 32 y.o. [phone] at 341w3d being seen today for ongoing prenatal care.  She is currently monitored for the following issues for this low-risk pregnancy and has Supervision of other normal pregnancy, antepartum on their problem list.  ----------------------------------------------------------------------------------- Patient reports numbness and tingling in both hands. Reviewed comfort measures.   Contractions: Not present. Vag. Bleeding: None.  Movement: Present. Denies leaking of fluid.  ----------------------------------------------------------------------------------- The following portions of the patient's history were reviewed and updated as appropriate: allergies, current medications, past family history, past medical history, past social history, past surgical history and problem list. Problem list updated.   Objective  Blood pressure 104/62, weight 186 lb (84.4 kg), last menstrual period 03/23/2017, unknown if currently breastfeeding. Pregravid weight 145 lb (65.8 kg) Total Weight Gain 41 lb (18.6 kg) Urinalysis: Urine Protein Small (1+)  Urine Glucose Negative  Fetal Status: Fetal Heart Rate (bpm): 147 Fundal Height: 32 cm Movement: Present     General:  Alert, oriented and cooperative. Patient is in no acute distress.  Skin: Skin is warm and dry. No rash noted.   Cardiovascular: Normal heart rate noted  Respiratory: Normal respiratory effort, no problems with respiration noted  Abdomen: Soft, gravid, appropriate for gestational age. Pain/Pressure: Absent     Pelvic:  Cervical exam deferred        Extremities: Normal range of motion.  Edema: None  Mental Status: Normal mood and affect. Normal behavior. Normal judgment and thought content.   Assessment   32 y.o. [id number] at 651w3d by  01/15/2018, by Ultrasound presenting for routine prenatal visit  Plan   Pregnancy #5 Problems (from 03/19/17 to present)    Problem  Noted Resolved   Supervision of other normal pregnancy, antepartum 06/04/2017 by Tresea MallGledhill, Itzell Bendavid, CNM No   Overview Addendum 07/28/2017 11:07 AM by Oswaldo ConroySchmid, Jacelyn Y, CNM    Clinic [facility] Prenatal Labs  Dating 6 week US Blood type: O/Positive/-- (06/27 1045)   Genetic Screen 1 Screen:    AFP:     Quad:     NIPS: Antibody:Negative (06/27 1045)  Anatomic US  Rubella: 1.37 (06/27 1045) Varicella: Immune  GTT Early:               Third trimester:  RPR: Non Reactive (06/27 1045)   Rhogam  HBsAg: Negative (06/27 1045)   TDaP vaccine                       Flu Shot: HIV: Non Reactive (06/27 1045)   Baby Food Formula                               GBS:   Contraception  Pap: 12/27/2015, LSIL  CBB     CS/VBAC NA   Support Person Husband Tashawn               Preterm labor symptoms and general obstetric precautions including but not limited to vaginal bleeding, contractions, leaking of fluid and fetal movement were reviewed in detail with the patient. Please refer to After Visit Summary for other counseling recommendations.   Return in about 2 weeks (around 12/07/2017) for rob.  Tresea MallJane Shakevia Sarris, CNM 11/23/2017 11:26 AM

## 2017-12-07 ENCOUNTER — Encounter: Payer: Medicaid Other | Admitting: Maternal Newborn

## 2017-12-10 ENCOUNTER — Encounter: Payer: Self-pay | Admitting: Certified Nurse Midwife

## 2017-12-10 ENCOUNTER — Ambulatory Visit (INDEPENDENT_AMBULATORY_CARE_PROVIDER_SITE_OTHER): Payer: Medicaid Other | Admitting: Certified Nurse Midwife

## 2017-12-10 VITALS — BP 118/50 | Wt 185.0 lb

## 2017-12-10 DIAGNOSIS — Z3A34 34 weeks gestation of pregnancy: Secondary | ICD-10-CM

## 2017-12-10 DIAGNOSIS — Z3480 Encounter for supervision of other normal pregnancy, unspecified trimester: Secondary | ICD-10-CM

## 2017-12-10 DIAGNOSIS — Z3483 Encounter for supervision of other normal pregnancy, third trimester: Secondary | ICD-10-CM

## 2017-12-10 NOTE — Progress Notes (Signed)
ROB- no concerns 

## 2017-12-10 NOTE — Progress Notes (Signed)
ROB at 34 weeks 6 days: Baby active. Denies regular contraction, vaginal bleeding Will be bottle feeding/ ?Mirena for contraception 34 week precautions ROB in 1 week for GBS  Farrel Connersolleen Derenda Giddings, CNM

## 2017-12-17 ENCOUNTER — Other Ambulatory Visit (HOSPITAL_COMMUNITY)
Admission: RE | Admit: 2017-12-17 | Discharge: 2017-12-17 | Disposition: A | Discharge status: Home / Self Care | Payer: Medicaid Other | Source: Ambulatory Visit | Attending: Obstetrics and Gynecology | Admitting: Obstetrics and Gynecology

## 2017-12-17 ENCOUNTER — Ambulatory Visit (INDEPENDENT_AMBULATORY_CARE_PROVIDER_SITE_OTHER): Payer: Medicaid Other | Admitting: Obstetrics and Gynecology

## 2017-12-17 ENCOUNTER — Encounter: Payer: Self-pay | Admitting: Obstetrics and Gynecology

## 2017-12-17 VITALS — BP 102/60 | Wt 189.0 lb

## 2017-12-17 DIAGNOSIS — Z3483 Encounter for supervision of other normal pregnancy, third trimester: Secondary | ICD-10-CM

## 2017-12-17 DIAGNOSIS — Z3A35 35 weeks gestation of pregnancy: Secondary | ICD-10-CM

## 2017-12-17 DIAGNOSIS — Z3480 Encounter for supervision of other normal pregnancy, unspecified trimester: Secondary | ICD-10-CM | POA: Insufficient documentation

## 2017-12-17 LAB — OB RESULTS CONSOLE GBS: GBS: NEGATIVE

## 2017-12-17 NOTE — Progress Notes (Signed)
ROB C/o some cramping   

## 2017-12-17 NOTE — Progress Notes (Signed)
    Routine Prenatal Care Visit  Subjective  Desiree Mosley is a 32 y.o. [phone] at 5262w6d being seen today for ongoing prenatal care.  She is currently monitored for the following issues for this low-risk pregnancy and has Supervision of other normal pregnancy, antepartum on their problem list.  ----------------------------------------------------------------------------------- Patient reports abdominal tenderness where she is being kicked by baby. .   Contractions: Not present. Vag. Bleeding: None.  Movement: Present. Denies leaking of fluid.  ----------------------------------------------------------------------------------- The following portions of the patient's history were reviewed and updated as appropriate: allergies, current medications, past family history, past medical history, past social history, past surgical history and problem list. Problem list updated.   Objective  Blood pressure 102/60, weight 189 lb (85.7 kg), last menstrual period 03/23/2017, unknown if currently breastfeeding. Pregravid weight 145 lb (65.8 kg) Total Weight Gain 44 lb (20 kg) Urinalysis:      Fetal Status: Fetal Heart Rate (bpm): 150 Fundal Height: 36 cm Movement: Present     General:  Alert, oriented and cooperative. Patient is in no acute distress.  Skin: Skin is warm and dry. No rash noted.   Cardiovascular: Normal heart rate noted  Respiratory: Normal respiratory effort, no problems with respiration noted  Abdomen: Soft, gravid, appropriate for gestational age. Pain/Pressure: Present     Pelvic:  Cervical exam performed        Extremities: Normal range of motion.     Mental Status: Normal mood and affect. Normal behavior. Normal judgment and thought content.     Assessment   32 y.o. [id number] at 4762w6d by  01/15/2018, by Ultrasound presenting for routine prenatal visit  Plan   Pregnancy #5 Problems (from 03/19/17 to present)    Problem Noted Resolved   Supervision of other normal  pregnancy, antepartum 06/04/2017 by Tresea MallGledhill, Jane, CNM No   Overview Addendum 12/17/2017 11:11 AM by Natale MilchSchuman, Mikaylee Arseneau R, MD    [facility]inic [facility] Prenatal Labs  Dating 6 week US Blood type: O/Positive/-- (06/27 1045)   Genetic Screen  Antibody:Negative (06/27 1045)  Anatomic US complete Rubella: 1.37 (06/27 1045) Varicella: Immune  GTT  Third trimester: 128 RPR: Non Reactive (06/27 1045)   Rhogam  not needed HBsAg: Negative (06/27 1045)   TDaP vaccine  Declined                      Flu Shot: HIV: Non Reactive (06/27 1045)   Baby Food Formula                               GBS:   Contraception  Mirena Pap: 12/27/2015, LSIL  CBB     CS/VBAC NA   Support Person Husband Toribio Harbourashawn               Gestational age appropriate obstetric precautions including but not limited to vaginal bleeding, contractions, leaking of fluid and fetal movement were reviewed in detail with the patient.    GBS, GC, CT today. Cervix is too posterior to be checked easily.   No follow-ups on file.  Natale Milchhristanna R Ericca Labra MD [facility], [facility] 12/17/2017, 11:42 AM

## 2017-12-17 NOTE — Addendum Note (Signed)
Addended by: Adelene IdlerSCHUMAN, Deano Tomaszewski on: 12/17/2017 11:44 AM   Modules accepted: Orders

## 2017-12-18 LAB — CERVICOVAGINAL ANCILLARY ONLY
Chlamydia: NEGATIVE
Neisseria Gonorrhea: NEGATIVE

## 2017-12-21 NOTE — Progress Notes (Signed)
Please call patient with negative result. Thank you,  Dr. Kataryna Mcquilkin

## 2017-12-22 LAB — CULTURE, BETA STREP (GROUP B ONLY): Strep Gp B Culture: NEGATIVE

## 2017-12-24 ENCOUNTER — Encounter: Payer: Self-pay | Admitting: Obstetrics and Gynecology

## 2017-12-24 ENCOUNTER — Ambulatory Visit (INDEPENDENT_AMBULATORY_CARE_PROVIDER_SITE_OTHER): Payer: Medicaid Other | Admitting: Obstetrics and Gynecology

## 2017-12-24 VITALS — BP 118/76 | Wt 190.0 lb

## 2017-12-24 DIAGNOSIS — Z3480 Encounter for supervision of other normal pregnancy, unspecified trimester: Secondary | ICD-10-CM

## 2017-12-24 DIAGNOSIS — Z3A36 36 weeks gestation of pregnancy: Secondary | ICD-10-CM

## 2017-12-24 DIAGNOSIS — Z3483 Encounter for supervision of other normal pregnancy, third trimester: Secondary | ICD-10-CM

## 2017-12-24 NOTE — Progress Notes (Signed)
  Routine Prenatal Care Visit  Subjective  Desiree Mosley is a 32 y.o. [phone] at 5476w6d being seen today for ongoing prenatal care.  She is currently monitored for the following issues for this low-risk pregnancy and has Supervision of other normal pregnancy, antepartum on their problem list.  ----------------------------------------------------------------------------------- Patient reports no complaints.   Contractions: Not present. Vag. Bleeding: None.  Movement: Present. Denies leaking of fluid.  ----------------------------------------------------------------------------------- The following portions of the patient's history were reviewed and updated as appropriate: allergies, current medications, past family history, past medical history, past social history, past surgical history and problem list. Problem list updated.   Objective  Blood pressure 118/76, weight 190 lb (86.2 kg), last menstrual period 03/23/2017, unknown if currently breastfeeding. Pregravid weight 145 lb (65.8 kg) Total Weight Gain 45 lb (20.4 kg) Urinalysis: Urine Protein    Urine Glucose    Fetal Status: Fetal Heart Rate (bpm): 150 Fundal Height: 36 cm Movement: Present  Presentation: Vertex  General:  Alert, oriented and cooperative. Patient is in no acute distress.  Skin: Skin is warm and dry. No rash noted.   Cardiovascular: Normal heart rate noted  Respiratory: Normal respiratory effort, no problems with respiration noted  Abdomen: Soft, gravid, appropriate for gestational age. Pain/Pressure: Absent     Pelvic:  Cervical exam deferred        Extremities: Normal range of motion.  Edema: None  Mental Status: Normal mood and affect. Normal behavior. Normal judgment and thought content.   Assessment   32 y.o. [id number] at 3276w6d by  01/15/2018, by Ultrasound presenting for routine prenatal visit  Plan   Pregnancy #5 Problems (from 03/19/17 to present)    Problem Noted Resolved   Supervision of other normal  pregnancy, antepartum 06/04/2017 by Tresea MallGledhill, Jane, CNM No   Overview Addendum 12/17/2017 11:11 AM by Natale MilchSchuman, Christanna R, MD    Clinic [facility] Prenatal Labs  Dating 6 week US Blood type: O/Positive/-- (06/27 1045)   Genetic Screen  Antibody:Negative (06/27 1045)  Anatomic US complete Rubella: 1.37 (06/27 1045) Varicella: Immune  GTT  Third trimester: 128 RPR: Non Reactive (06/27 1045)   Rhogam  not needed HBsAg: Negative (06/27 1045)   TDaP vaccine  Declined                      Flu Shot: HIV: Non Reactive (06/27 1045)   Baby Food Formula                               GBS:   Contraception  Mirena Pap: 12/27/2015, LSIL  CBB     CS/VBAC NA   Support Person Husband Tashawn               Preterm labor symptoms and general obstetric precautions including but not limited to vaginal bleeding, contractions, leaking of fluid and fetal movement were reviewed in detail with the patient. Please refer to After Visit Summary for other counseling recommendations.   Return in about 1 week (around 12/31/2017) for Routine Prenatal Appointment.  Thomasene MohairStephen Jackson, MD, Merlinda FrederickFACOG [facility], [facility] 12/24/2017 12:07 PM

## 2017-12-31 ENCOUNTER — Ambulatory Visit (INDEPENDENT_AMBULATORY_CARE_PROVIDER_SITE_OTHER): Payer: Medicaid Other | Admitting: Certified Nurse Midwife

## 2017-12-31 VITALS — BP 96/60 | Wt 185.0 lb

## 2017-12-31 DIAGNOSIS — Z3480 Encounter for supervision of other normal pregnancy, unspecified trimester: Secondary | ICD-10-CM

## 2017-12-31 DIAGNOSIS — Z3A37 37 weeks gestation of pregnancy: Secondary | ICD-10-CM

## 2017-12-31 NOTE — Progress Notes (Signed)
No concerns; declines cx [email or web address]

## 2018-01-06 NOTE — L&D Delivery Note (Signed)
Obstetrical Delivery Note   Date of Delivery:   01/16/2018 Primary OB:   [facility] Gestational Age/EDD: [redacted]w[redacted]d (Dated by [id number] ultrasound) Antepartum complications: anemia  Delivered By:   Farrel Conners, CNM  Delivery Type:   spontaneous vaginal delivery  Procedure Details:   Mother pushed to deliver a viable female infant. Encountered tight shoulders, but anterior shoulder delivered with steady gentle traction and McRoberts. Baby placed on mother's abdomen and dried and suctioned. After delayed cord clamping, the cord was cut by the father of the baby. Baby then placed skin to skin on mother's chest. Terminal meconium noted with delivery. Attempted to deliver placenta by using Pitocin and bimanual massage, but unable to feel placenta at cervical os. Dr Jerene Pitch consulted for adherent placenta after 30 minutes. Her Schuman was unable to manually extract adherent placenta and explained to patient need to do D&C in OR. Anesthesia:    epidural Intrapartum complications: Tight shoulders and retained placenta GBS:    negative Laceration:    none Episiotomy:    none Placenta:    Retained placenta Estimated Blood Loss:  400 mL  Baby:    Liveborn female, Apgars 9/9, weight 8#13.5 oz Kayleen Memos, CNM

## 2018-01-07 NOTE — Progress Notes (Signed)
ROB at 37wk6d: Denies regular contractions, vaginal bleeding or leakage of fluid. Baby active. FHTs WNL Bottle/ ?Mirena Labor precautions ROB in 1 week if not in labor.  Farrel Conners, CNM

## 2018-01-08 ENCOUNTER — Ambulatory Visit (INDEPENDENT_AMBULATORY_CARE_PROVIDER_SITE_OTHER): Payer: Medicaid Other | Admitting: Certified Nurse Midwife

## 2018-01-08 VITALS — BP 100/66 | Wt 189.0 lb

## 2018-01-08 DIAGNOSIS — Z3480 Encounter for supervision of other normal pregnancy, unspecified trimester: Secondary | ICD-10-CM

## 2018-01-08 DIAGNOSIS — Z3A39 39 weeks gestation of pregnancy: Secondary | ICD-10-CM

## 2018-01-08 NOTE — Progress Notes (Signed)
ROB

## 2018-01-09 NOTE — Progress Notes (Signed)
ROB at 39 weeks: More uncomfortable today, irregular contractions. Increased pressure. No vaginal bleeding or LOF. Baby active. Wants to arrange IOL. Cervix closed and long and posterior GBS negative IOL arranged for 01/15/2018 (40 weeks) Labor precautions Farrel Connersolleen Abrielle Finck, CNM

## 2018-01-09 NOTE — H&P (Signed)
OB History & Physical   History of Present Illness:  Chief Complaint:   HPI:  Mallery Balbi is a 33 y.o. [phone] female with EDC=01/15/2018 at [redacted] wk gestation dated by a 6 week 5 day ultrasound.  Her pregnancy has been uncomplicated.  She presents to L&D for an elective IOL.Marland Kitchen Has been having irregular contractions and increased pelvic pressure. No vaginal bleeding or leakage of fluid. Baby active   Prenatal care site: Prenatal care at [facility] has also been remarkable for  [facility] Prenatal Labs  Dating 6 week [place] Blood type: O/Positive/-- (06/27 1045)   Genetic Screen declines Antibody:Negative (06/27 1045)  Anatomic [place] complete Rubella: 1.37 (06/27 1045) Varicella: Immune  GTT  Third trimester: 128 RPR: Non Reactive (06/27 1045)   Rhogam  not needed HBsAg: Negative (06/27 1045)   TDaP vaccine  Declined                      Flu Shot: HIV: Non Reactive (06/27 1045)   Baby Food Formula                               GBS: negative  Contraception  Mirena Pap: 12/27/2015, LSIL  CBB     CS/VBAC NA   Support Person Husband Tashawn           Maternal Medical History:  No past medical history on file.  Past Surgical History:  Procedure Laterality Date  . miscarrage      No Known Allergies  Prior to Admission medications   Medication Sig Start Date End Date Taking? Authorizing Provider  Prenatal Vit-Fe Fumarate-FA (MULTIVITAMIN-PRENATAL) 27-0.8 MG TABS tablet Take 1 tablet by mouth daily at 12 noon.    [provider]          Social History: She  reports that she has quit smoking. Her smoking use included cigarettes. She has never used smokeless tobacco. She reports that she does not drink alcohol or use drugs.  Family History: family history includes Cancer in her father, maternal grandmother, and mother; Thyroid disease in her mother.   Review of Systems: Negative x 10 systems reviewed except as noted in the HPI.      Physical Exam:  Vital  Signs: BP 100/66   Wt 189 lb (85.7 kg)   LMP 03/23/2017 (Approximate)   BMI 30.51 kg/m  General: gravid WF in no acute distress.  HEENT: normocephalic, atraumatic Heart: regular rate & rhythm.  No murmurs/rubs/gallops Lungs: clear to auscultation bilaterally Abdomen: soft, gravid, non-tender;  EFW: 8 1/2 to 9 # Pelvic:   External: Normal external female genitalia  Cervix: Dilation: Closed / Effacement (%): 20 / Station: -2     ROM: +/- pooling; +/- nitrazine; +/- ferning Extremities: non-tender, symmetric, trace edema bilaterally.  DTRs:   Neurologic: Alert & oriented x 3.   Baseline FHR:  Toco: Bedside Ultrasound:    Assessment:  Vania Budhram is a 33 y.o. [phone] female at [redacted] weeks gestation for elective induction  FWB: Plan:  1. Admit to Labor & Delivery for induction of labor. Discussed pros and cons of induction. She is aware of risks of hyperstimulation, fetal intolerance to labor, and Cesarean section.She verbalizes understanding and wishes to proceed. Plan Cytotec and an insertion of foley balloon when able. 2. CBC, T&S, Clrs, IVF 3. GBS negative.   4. Consents obtained. 5. O POS/ VI/ RI 6. Bottle/  Mirena 7. Epidural when active  Farrel Conners  01/09/2018 6:18 PM

## 2018-01-09 NOTE — H&P (Deleted)
  The note originally documented on this encounter has been moved the the encounter in which it belongs.  

## 2018-01-11 ENCOUNTER — Other Ambulatory Visit: Payer: Self-pay

## 2018-01-11 ENCOUNTER — Observation Stay
Admission: EM | Admit: 2018-01-11 | Discharge: 2018-01-11 | Disposition: A | Discharge status: Home / Self Care | Payer: Medicaid Other | Attending: Obstetrics and Gynecology | Admitting: Obstetrics and Gynecology

## 2018-01-11 DIAGNOSIS — O471 False labor at or after 37 completed weeks of gestation: Principal | ICD-10-CM | POA: Insufficient documentation

## 2018-01-11 DIAGNOSIS — Z3480 Encounter for supervision of other normal pregnancy, unspecified trimester: Secondary | ICD-10-CM

## 2018-01-11 DIAGNOSIS — Z3A39 39 weeks gestation of pregnancy: Secondary | ICD-10-CM | POA: Insufficient documentation

## 2018-01-11 DIAGNOSIS — Z87891 Personal history of nicotine dependence: Secondary | ICD-10-CM | POA: Diagnosis not present

## 2018-01-11 NOTE — OB Triage Note (Signed)
[name] here with contractions since 0430, states they are 5-7 min apart, denies bleeding, LOF, reports positive fetal movement.

## 2018-01-11 NOTE — Discharge Summary (Signed)
Physician Final Progress Note  Patient ID: Desiree Mosley MRN: [id number] DOB/AGE: May 31, 1985 31 y.o.  Admit date: 01/11/2018 Admitting provider: Conard Novak, MD Discharge date: 01/11/2018   Admission Diagnoses: contractions  Discharge Diagnoses:  Active Problems:   Indication for care in labor and delivery, antepartum IUP at 39 weeks  History of Present Illness: The patient is a 33 y.o. female [phone] at [redacted]w[redacted]d who presents for every 5-7 minute contractions since 4:30 this morning. She had intercourse last night. She admits positive fetal movement. She denies leakage of fluid or vaginal bleeding. Labor precautions reviewed.   History reviewed. No pertinent past medical history.  Past Surgical History:  Procedure Laterality Date  . miscarriage      No current facility-administered medications on file prior to encounter.    Current Outpatient Medications on File Prior to Encounter  Medication Sig Dispense Refill  . Prenatal Vit-Fe Fumarate-FA (MULTIVITAMIN-PRENATAL) 27-0.8 MG TABS tablet Take 1 tablet by mouth daily at 12 noon.      No Known Allergies  Social History   Socioeconomic History  . Marital status: Single    Spouse name: Not on file  . Number of children: Not on file  . Years of education: Not on file  . Highest education level: Not on file  Occupational History  . Not on file  Social Needs  . Financial resource strain: Not on file  . Food insecurity:    Worry: Not on file    Inability: Not on file  . Transportation needs:    Medical: Not on file    Non-medical: Not on file  Tobacco Use  . Smoking status: Former Smoker    Types: Cigarettes  . Smokeless tobacco: Never Used  Substance and Sexual Activity  . Alcohol use: No  . Drug use: No  . Sexual activity: Yes    Birth control/protection: I.U.D., None  Lifestyle  . Physical activity:    Days per week: Not on file    Minutes per session: Not on file  . Stress: Not on file  Relationships   . Social connections:    Talks on phone: Not on file    Gets together: Not on file    Attends religious service: Not on file    Active member of club or organization: Not on file    Attends meetings of clubs or organizations: Not on file    Relationship status: Not on file  . Intimate partner violence:    Fear of current or ex partner: Not on file    Emotionally abused: Not on file    Physically abused: Not on file    Forced sexual activity: Not on file  Other Topics Concern  . Not on file  Social History Narrative  . Not on file    Family History  Problem Relation Age of Onset  . Cancer Mother   . Thyroid disease Mother   . Cancer Father   . Cancer Maternal Grandmother      Review of Systems  Constitutional: Negative.   HENT: Negative.   Eyes: Negative.   Respiratory: Negative.   Cardiovascular: Negative.   Gastrointestinal: Negative.   Genitourinary: Negative.   Musculoskeletal: Negative.   Skin: Negative.   Neurological: Negative.   Endo/Heme/Allergies: Negative.   Psychiatric/Behavioral: Negative.      Physical Exam: BP 128/72 (BP Location: Left Arm)   Pulse 98   Temp 98.2 F (36.8 C) (Oral)   Resp 16   LMP 03/23/2017 (Approximate)  Constitutional: Well nourished, well developed female in no acute distress.  HEENT: normal Skin: Warm and dry.  Cardiovascular: Regular rate and rhythm.   Extremity: no edema  Respiratory: Clear to auscultation bilateral. Normal respiratory effort Abdomen: FHT present, mild to palpation Back: no CVAT Neuro: DTRs 2+, Cranial nerves grossly intact Psych: Alert and Oriented x3. No memory deficits. Normal mood and affect.  MS: normal gait, normal bilateral lower extremity ROM/strength/stability.  Pelvic exam: by RN: 0/thick/soft/posterior/-3 Toco: every 4-8 Fetal well being: 155 bpm, moderate variability, +accelerations, -decelerations   Consults: None  Significant Findings/ Diagnostic Studies: none  Procedures:  NST  Hospital Course: The patient was admitted to Labor and Delivery Triage for observation.   Discharge Condition: good  Disposition: Discharge disposition: 01-Home or Self Care       Diet: Regular diet  Discharge Activity: Activity as tolerated  Discharge Instructions    Discharge activity:  No Restrictions   Complete by:  As directed    Discharge diet:  No restrictions   Complete by:  As directed    Fetal Kick Count:  Lie on our left side for one hour after a meal, and count the number of times your baby kicks.  If it is less than 5 times, get up, move around and drink some juice.  Repeat the test 30 minutes later.  If it is still less than 5 kicks in an hour, notify your doctor.   Complete by:  As directed    LABOR:  When conractions begin, you should start to time them from the beginning of one contraction to the beginning  of the next.  When contractions are 5 - 10 minutes apart or less and have been regular for at least an hour, you should call your health care provider.   Complete by:  As directed    No sexual activity restrictions   Complete by:  As directed    Notify physician for bleeding from the vagina   Complete by:  As directed    Notify physician for blurring of vision or spots before the eyes   Complete by:  As directed    Notify physician for chills or fever   Complete by:  As directed    Notify physician for fainting spells, "black outs" or loss of consciousness   Complete by:  As directed    Notify physician for increase in vaginal discharge   Complete by:  As directed    Notify physician for leaking of fluid   Complete by:  As directed    Notify physician for pain or burning when urinating   Complete by:  As directed    Notify physician for pelvic pressure (sudden increase)   Complete by:  As directed    Notify physician for severe or continued nausea or vomiting   Complete by:  As directed    Notify physician for sudden gushing of fluid from the  vagina (with or without continued leaking)   Complete by:  As directed    Notify physician for sudden, constant, or occasional abdominal pain   Complete by:  As directed    Notify physician if baby moving less than usual   Complete by:  As directed      Allergies as of 01/11/2018   No Known Allergies     Medication List    TAKE these medications   multivitamin-prenatal 27-0.8 MG Tabs tablet Take 1 tablet by mouth daily at 12 noon.  Total time spent taking care of this patient: 15 minutes  Signed: Tresea MallJane Ariely Riddell, CNM  01/11/2018, 8:59 AM

## 2018-01-15 ENCOUNTER — Other Ambulatory Visit: Payer: Self-pay

## 2018-01-15 ENCOUNTER — Inpatient Hospital Stay: Payer: Medicaid Other | Admitting: Anesthesiology

## 2018-01-15 ENCOUNTER — Inpatient Hospital Stay
Admission: EM | Admit: 2018-01-15 | Discharge: 2018-01-18 | DRG: 768 | Disposition: A | Discharge status: Home / Self Care | Payer: Medicaid Other | Attending: Certified Nurse Midwife | Admitting: Certified Nurse Midwife

## 2018-01-15 DIAGNOSIS — Z87891 Personal history of nicotine dependence: Secondary | ICD-10-CM

## 2018-01-15 DIAGNOSIS — Z3A40 40 weeks gestation of pregnancy: Secondary | ICD-10-CM

## 2018-01-15 DIAGNOSIS — Z3490 Encounter for supervision of normal pregnancy, unspecified, unspecified trimester: Secondary | ICD-10-CM | POA: Diagnosis present

## 2018-01-15 DIAGNOSIS — O43213 Placenta accreta, third trimester: Secondary | ICD-10-CM | POA: Diagnosis present

## 2018-01-15 DIAGNOSIS — D62 Acute posthemorrhagic anemia: Secondary | ICD-10-CM | POA: Diagnosis not present

## 2018-01-15 DIAGNOSIS — Z3483 Encounter for supervision of other normal pregnancy, third trimester: Secondary | ICD-10-CM | POA: Diagnosis present

## 2018-01-15 DIAGNOSIS — O9081 Anemia of the puerperium: Secondary | ICD-10-CM | POA: Diagnosis not present

## 2018-01-15 DIAGNOSIS — Z3480 Encounter for supervision of other normal pregnancy, unspecified trimester: Secondary | ICD-10-CM

## 2018-01-15 LAB — CBC
HCT: 30.1 % — ABNORMAL LOW (ref 36.0–46.0)
Hemoglobin: 9.5 g/dL — ABNORMAL LOW (ref 12.0–15.0)
MCH: 26 pg (ref 26.0–34.0)
MCHC: 31.6 g/dL (ref 30.0–36.0)
MCV: 82.5 fL (ref 80.0–100.0)
Platelets: 217 10*3/uL (ref 150–400)
RBC: 3.65 MIL/uL — ABNORMAL LOW (ref 3.87–5.11)
RDW: 15.9 % — ABNORMAL HIGH (ref 11.5–15.5)
WBC: 11.9 10*3/uL — ABNORMAL HIGH (ref 4.0–10.5)
nRBC: 0 % (ref 0.0–0.2)

## 2018-01-15 MED ORDER — OXYTOCIN 40 UNITS IN NORMAL SALINE INFUSION - SIMPLE MED
1.0000 m[IU]/min | INTRAVENOUS | Status: DC
  Administered 2018-01-15: 2 m[IU]/min via INTRAVENOUS
  Filled 2018-01-15: qty 1000

## 2018-01-15 MED ORDER — FENTANYL 2.5 MCG/ML W/ROPIVACAINE 0.15% IN NS 100 ML EPIDURAL (ARMC)
EPIDURAL | Status: AC
  Filled 2018-01-15: qty 100

## 2018-01-15 MED ORDER — EPHEDRINE 5 MG/ML INJ
10.0000 mg | INTRAVENOUS | Status: DC | PRN
  Administered 2018-01-16: 10 mg via INTRAVENOUS
  Filled 2018-01-15: qty 4

## 2018-01-15 MED ORDER — LACTATED RINGERS IV SOLN: 500.0000 mL | Freq: Once | INTRAVENOUS | Status: DC

## 2018-01-15 MED ORDER — FENTANYL 2.5 MCG/ML W/ROPIVACAINE 0.15% IN NS 100 ML EPIDURAL (ARMC): 12.0000 mL/h | EPIDURAL | Status: DC

## 2018-01-15 MED ORDER — ONDANSETRON HCL 4 MG/2ML IJ SOLN
4.0000 mg | Freq: Four times a day (QID) | INTRAMUSCULAR | Status: DC | PRN
  Administered 2018-01-16: 4 mg via INTRAVENOUS
  Filled 2018-01-15: qty 2

## 2018-01-15 MED ORDER — OXYTOCIN BOLUS FROM INFUSION: 500.0000 mL | Freq: Once | INTRAVENOUS | Status: DC

## 2018-01-15 MED ORDER — LACTATED RINGERS IV SOLN
500.0000 mL | INTRAVENOUS | Status: DC | PRN
  Administered 2018-01-15: 500 mL via INTRAVENOUS

## 2018-01-15 MED ORDER — DIPHENHYDRAMINE HCL 50 MG/ML IJ SOLN
12.5000 mg | INTRAMUSCULAR | Status: DC | PRN
  Administered 2018-01-16 (×2): 12.5 mg via INTRAVENOUS
  Filled 2018-01-15: qty 1

## 2018-01-15 MED ORDER — LACTATED RINGERS IV SOLN
INTRAVENOUS | Status: DC
  Administered 2018-01-15 – 2018-01-16 (×6): via INTRAVENOUS

## 2018-01-15 MED ORDER — LIDOCAINE HCL (PF) 1 % IJ SOLN: 30.0000 mL | INTRAMUSCULAR | Status: DC | PRN

## 2018-01-15 MED ORDER — AMMONIA AROMATIC IN INHA: 0.3000 mL | Freq: Once | RESPIRATORY_TRACT | Status: DC | PRN

## 2018-01-15 MED ORDER — LIDOCAINE HCL (PF) 1 % IJ SOLN
INTRAMUSCULAR | Status: DC | PRN
  Administered 2018-01-15: 4 mL via INTRADERMAL

## 2018-01-15 MED ORDER — MISOPROSTOL 25 MCG QUARTER TABLET
25.0000 ug | ORAL_TABLET | ORAL | Status: DC | PRN
  Filled 2018-01-15: qty 1

## 2018-01-15 MED ORDER — OXYTOCIN 40 UNITS IN NORMAL SALINE INFUSION - SIMPLE MED
2.5000 [IU]/h | INTRAVENOUS | Status: DC
  Administered 2018-01-16: 10 [IU]/h via INTRAVENOUS

## 2018-01-15 MED ORDER — PHENYLEPHRINE 40 MCG/ML (10ML) SYRINGE FOR IV PUSH (FOR BLOOD PRESSURE SUPPORT): 80.0000 ug | PREFILLED_SYRINGE | INTRAVENOUS | Status: DC | PRN

## 2018-01-15 MED ORDER — LIDOCAINE-EPINEPHRINE (PF) 1.5 %-1:200000 IJ SOLN
INTRAMUSCULAR | Status: DC | PRN
  Administered 2018-01-15: 4 mL via EPIDURAL

## 2018-01-15 MED ORDER — MISOPROSTOL 25 MCG QUARTER TABLET
ORAL_TABLET | ORAL | Status: AC
  Administered 2018-01-15: 25 ug
  Filled 2018-01-15: qty 1

## 2018-01-15 MED ORDER — BUTORPHANOL TARTRATE 1 MG/ML IJ SOLN
1.0000 mg | INTRAMUSCULAR | Status: DC | PRN
  Administered 2018-01-15 (×2): 1 mg via INTRAVENOUS
  Filled 2018-01-15 (×2): qty 1

## 2018-01-15 MED ORDER — FAMOTIDINE 20 MG PO TABS
20.0000 mg | ORAL_TABLET | Freq: Two times a day (BID) | ORAL | Status: DC | PRN
  Administered 2018-01-15: 20 mg via ORAL

## 2018-01-15 MED ORDER — TERBUTALINE SULFATE 1 MG/ML IJ SOLN: 0.2500 mg | Freq: Once | INTRAMUSCULAR | Status: DC | PRN

## 2018-01-15 MED ORDER — FAMOTIDINE 20 MG PO TABS
ORAL_TABLET | ORAL | Status: AC
  Filled 2018-01-15: qty 1

## 2018-01-15 MED ORDER — EPHEDRINE 5 MG/ML INJ: 10.0000 mg | INTRAVENOUS | Status: DC | PRN

## 2018-01-15 MED ORDER — MISOPROSTOL 200 MCG PO TABS: 800.0000 ug | ORAL_TABLET | Freq: Once | ORAL | Status: DC | PRN

## 2018-01-15 MED ORDER — BUPIVACAINE HCL (PF) 0.25 % IJ SOLN
INTRAMUSCULAR | Status: DC | PRN
  Administered 2018-01-15 (×2): 4 mL via EPIDURAL

## 2018-01-15 NOTE — Progress Notes (Signed)
Date of Initial H&P: 01/08/2018  History reviewed, patient examined, and agree with H&P with the following changes.  Desiree Mosley is a 33 y.o. [phone] female with EDC=01/15/2018 at [redacted] wk gestation dated by a 6 week 5 day ultrasound.  Her pregnancy has been uncomplicated.  She presents to L&D for an elective IOL.Marland Kitchen Has been having irregular contractions and increased pelvic pressure. No vaginal bleeding or leakage of fluid. Baby active   General: gravid WF in NAD Abdomen: soft, NT uterus. Cephalic oon leopold's. EFW: 8# FHR: 145-150 with accelerations to 160s to 170s, moderate variability Toco: irregular contractions  Cervix: very posterior/ long/ -1 to -2 station/ can't reach cervical os   A: IUP at 40 weeks for elective IOL Low bishop score Cat 1 tracing  P: 1. Admit to Labor & Delivery for induction of labor. Discussed pros and cons of induction. She is aware of risks of hyperstimulation, fetal intolerance to labor, and Cesarean section.She verbalizes understanding and wishes to proceed. Plan Cytotec and an insertion of foley balloon when able. 2. CBC, T&S, Clrs, IVF 3. GBS negative.   4. Consents obtained. 5. O POS/ VI/ RI 6. Bottle/ Mirena 7. Epidural when active  Farrel Conners, CNM

## 2018-01-15 NOTE — Anesthesia Procedure Notes (Signed)
Epidural Patient location during procedure: OB  Staffing Performed: anesthesiologist   Preanesthetic Checklist Completed: patient identified, site marked, surgical consent, pre-op evaluation, timeout performed, IV checked, risks and benefits discussed and monitors and equipment checked  Epidural Patient position: sitting Prep: Betadine Patient monitoring: heart rate, continuous pulse ox and blood pressure Approach: midline Location: L4-L5 Injection technique: LOR saline  Needle:  Needle type: Tuohy  Needle gauge: 18 G Needle length: 9 cm and 9 Needle insertion depth: 6 cm Catheter type: closed end flexible Catheter size: 20 Guage Catheter at skin depth: 11 cm Test dose: negative and 1.5% lidocaine with Epi 1:200 K  Assessment Sensory level: T10 Events: blood not aspirated, injection not painful, no injection resistance, negative IV test and no paresthesia  Additional Notes   Patient tolerated the insertion well without complications.-SATD -IVTD. No paresthesia. Refer to [facility] nursing for VS and dosingReason for block:procedure for pain

## 2018-01-15 NOTE — Anesthesia Preprocedure Evaluation (Signed)
Anesthesia Evaluation  Patient identified by MRN, date of birth, ID band Patient awake    Reviewed: Allergy & Precautions, H&P , NPO status , Patient's Chart, lab work & pertinent test results, reviewed documented beta blocker date and time   Airway Mallampati: II  TM Distance: >3 FB Neck ROM: full    Dental no notable dental hx. (+) Teeth Intact   Pulmonary neg pulmonary ROS, Current Smoker, former smoker,    Pulmonary exam normal breath sounds clear to auscultation       Cardiovascular Exercise Tolerance: Good negative cardio ROS   Rhythm:regular Rate:Normal     Neuro/Psych negative neurological ROS  negative psych ROS   GI/Hepatic negative GI ROS, Neg liver ROS,   Endo/Other  negative endocrine ROSdiabetes  Renal/GU      Musculoskeletal   Abdominal   Peds  Hematology negative hematology ROS (+)   Anesthesia Other Findings   Reproductive/Obstetrics (+) Pregnancy                             Anesthesia Physical Anesthesia Plan  ASA: II  Anesthesia Plan: Epidural   Post-op Pain Management:    Induction:   PONV Risk Score and Plan:   Airway Management Planned:   Additional Equipment:   Intra-op Plan:   Post-operative Plan:   Informed Consent: I have reviewed the patients History and Physical, chart, labs and discussed the procedure including the risks, benefits and alternatives for the proposed anesthesia with the patient or authorized representative who has indicated his/her understanding and acceptance.       Plan Discussed with:   Anesthesia Plan Comments:         Anesthesia Quick Evaluation  

## 2018-01-15 NOTE — Progress Notes (Addendum)
L&D Progress Note   S: Contractions are inconsistent in strength. But would like something for pain.   O: BP (!) 101/46 (BP Location: Right Arm)   Pulse (!) 104   Temp 98.2 F (36.8 C) (Oral)   Resp 16   Ht 5\' 6"  (1.676 m)   Wt 86.2 kg   LMP 03/23/2017 (Approximate)   BMI 30.67 kg/m    After first dose of Cytotec, patient began contracting every 2-3 minutes. Cervix remains posterior but was able to finally reach internal os. Cervix has progressed from 1.5 to 2.5 cm / 40-50%/-1, but no change over the last 1.5 to 2 hours. + bloody show. Contractions palpate mild to moderate and appear to still be every 2-3 minutes apart. FHR 140 baseline with accelerations to 160s, moderate variability  A: IOL in progress FWB: overall reassuring FH strip  P: Start Pitocin Stadol for pain  Farrel Conners, CNM

## 2018-01-16 ENCOUNTER — Encounter
Admission: EM | Disposition: A | Discharge status: Home / Self Care | Payer: Self-pay | Source: Home / Self Care | Attending: Certified Nurse Midwife

## 2018-01-16 ENCOUNTER — Encounter: Payer: Self-pay | Admitting: Certified Nurse Midwife

## 2018-01-16 ENCOUNTER — Inpatient Hospital Stay: Payer: Medicaid Other | Admitting: Anesthesiology

## 2018-01-16 DIAGNOSIS — O9902 Anemia complicating childbirth: Secondary | ICD-10-CM

## 2018-01-16 DIAGNOSIS — O480 Post-term pregnancy: Secondary | ICD-10-CM

## 2018-01-16 DIAGNOSIS — O43213 Placenta accreta, third trimester: Secondary | ICD-10-CM

## 2018-01-16 DIAGNOSIS — Z3A40 40 weeks gestation of pregnancy: Secondary | ICD-10-CM

## 2018-01-16 HISTORY — PX: ABDOMINAL HYSTERECTOMY: SHX81

## 2018-01-16 HISTORY — PX: DILATION AND CURETTAGE OF UTERUS: SHX78

## 2018-01-16 LAB — RPR: RPR Ser Ql: NONREACTIVE

## 2018-01-16 LAB — CBC
HCT: 24.9 % — ABNORMAL LOW (ref 36.0–46.0)
HCT: 29.8 % — ABNORMAL LOW (ref 36.0–46.0)
Hemoglobin: 7.8 g/dL — ABNORMAL LOW (ref 12.0–15.0)
Hemoglobin: 9.6 g/dL — ABNORMAL LOW (ref 12.0–15.0)
MCH: 26.9 pg (ref 26.0–34.0)
MCH: 27.3 pg (ref 26.0–34.0)
MCHC: 31.3 g/dL (ref 30.0–36.0)
MCHC: 32.2 g/dL (ref 30.0–36.0)
MCV: 85.9 fL (ref 80.0–100.0)
MCV: 84.7 fL (ref 80.0–100.0)
Platelets: 135 10*3/uL — ABNORMAL LOW (ref 150–400)
Platelets: 151 10*3/uL (ref 150–400)
RBC: 2.9 MIL/uL — ABNORMAL LOW (ref 3.87–5.11)
RBC: 3.52 MIL/uL — ABNORMAL LOW (ref 3.87–5.11)
RDW: 16.2 % — ABNORMAL HIGH (ref 11.5–15.5)
RDW: 15.7 % — ABNORMAL HIGH (ref 11.5–15.5)
WBC: 15.1 10*3/uL — ABNORMAL HIGH (ref 4.0–10.5)
WBC: 15.1 10*3/uL — ABNORMAL HIGH (ref 4.0–10.5)
nRBC: 0 % (ref 0.0–0.2)
nRBC: 0 % (ref 0.0–0.2)

## 2018-01-16 LAB — PREPARE RBC (CROSSMATCH)

## 2018-01-16 SURGERY — DILATION AND CURETTAGE
Anesthesia: General

## 2018-01-16 MED ORDER — ONDANSETRON HCL 4 MG PO TABS: 4.0000 mg | ORAL_TABLET | Freq: Four times a day (QID) | ORAL | Status: DC | PRN

## 2018-01-16 MED ORDER — SIMETHICONE 80 MG PO CHEW
80.0000 mg | CHEWABLE_TABLET | ORAL | Status: DC | PRN
  Administered 2018-01-17 (×2): 80 mg via ORAL
  Filled 2018-01-16: qty 1

## 2018-01-16 MED ORDER — SOD CITRATE-CITRIC ACID 500-334 MG/5ML PO SOLN: 30.0000 mL | ORAL | Status: DC

## 2018-01-16 MED ORDER — IBUPROFEN 600 MG PO TABS: 600.0000 mg | ORAL_TABLET | Freq: Four times a day (QID) | ORAL | Status: DC

## 2018-01-16 MED ORDER — SODIUM CHLORIDE 0.9 % IV SOLN
INTRAVENOUS | Status: DC | PRN
  Administered 2018-01-16: 09:00:00 via INTRAVENOUS

## 2018-01-16 MED ORDER — DEXAMETHASONE SODIUM PHOSPHATE 10 MG/ML IJ SOLN
INTRAMUSCULAR | Status: AC
  Filled 2018-01-16: qty 1

## 2018-01-16 MED ORDER — LIDOCAINE HCL (PF) 2 % IJ SOLN
INTRAMUSCULAR | Status: AC
  Filled 2018-01-16: qty 10

## 2018-01-16 MED ORDER — SUGAMMADEX SODIUM 200 MG/2ML IV SOLN
INTRAVENOUS | Status: DC | PRN
  Administered 2018-01-16: 200 mg via INTRAVENOUS

## 2018-01-16 MED ORDER — FERROUS SULFATE 325 (65 FE) MG PO TABS
325.0000 mg | ORAL_TABLET | Freq: Two times a day (BID) | ORAL | Status: DC
  Administered 2018-01-16 – 2018-01-18 (×4): 325 mg via ORAL
  Filled 2018-01-16 (×4): qty 1

## 2018-01-16 MED ORDER — BUPIVACAINE HCL (PF) 0.5 % IJ SOLN
INTRAMUSCULAR | Status: AC
  Filled 2018-01-16: qty 10

## 2018-01-16 MED ORDER — BENZOCAINE-MENTHOL 20-0.5 % EX AERO: 1.0000 "application " | INHALATION_SPRAY | CUTANEOUS | Status: DC | PRN

## 2018-01-16 MED ORDER — KETAMINE HCL 100 MG/ML IJ SOLN
INTRAMUSCULAR | Status: DC | PRN
  Administered 2018-01-16: 60 mg via INTRAVENOUS

## 2018-01-16 MED ORDER — ACETAMINOPHEN 500 MG PO TABS: 1000.0000 mg | ORAL_TABLET | Freq: Four times a day (QID) | ORAL | Status: DC

## 2018-01-16 MED ORDER — FENTANYL CITRATE (PF) 100 MCG/2ML IJ SOLN: 25.0000 ug | INTRAMUSCULAR | Status: DC | PRN

## 2018-01-16 MED ORDER — SODIUM CHLORIDE 0.9% FLUSH: 3.0000 mL | INTRAVENOUS | Status: DC | PRN

## 2018-01-16 MED ORDER — MIDAZOLAM HCL 2 MG/2ML IJ SOLN
INTRAMUSCULAR | Status: AC
  Filled 2018-01-16: qty 2

## 2018-01-16 MED ORDER — NALBUPHINE HCL 10 MG/ML IJ SOLN: 5.0000 mg | Freq: Once | INTRAMUSCULAR | Status: DC | PRN

## 2018-01-16 MED ORDER — DIPHENHYDRAMINE HCL 50 MG/ML IJ SOLN: 12.5000 mg | INTRAMUSCULAR | Status: DC | PRN

## 2018-01-16 MED ORDER — OXYCODONE-ACETAMINOPHEN 5-325 MG PO TABS
2.0000 | ORAL_TABLET | ORAL | Status: DC | PRN
  Administered 2018-01-17 – 2018-01-18 (×5): 2 via ORAL
  Filled 2018-01-16 (×5): qty 2

## 2018-01-16 MED ORDER — KETOROLAC TROMETHAMINE 30 MG/ML IJ SOLN: 30.0000 mg | Freq: Four times a day (QID) | INTRAMUSCULAR | Status: AC | PRN

## 2018-01-16 MED ORDER — ONDANSETRON HCL 4 MG/2ML IJ SOLN: 4.0000 mg | Freq: Four times a day (QID) | INTRAMUSCULAR | Status: DC | PRN

## 2018-01-16 MED ORDER — DOCUSATE SODIUM 100 MG PO CAPS
100.0000 mg | ORAL_CAPSULE | Freq: Two times a day (BID) | ORAL | Status: DC
  Administered 2018-01-16 – 2018-01-18 (×4): 100 mg via ORAL
  Filled 2018-01-16 (×4): qty 1

## 2018-01-16 MED ORDER — OXYCODONE-ACETAMINOPHEN 5-325 MG PO TABS
1.0000 | ORAL_TABLET | ORAL | Status: DC | PRN
  Administered 2018-01-16 – 2018-01-17 (×3): 1 via ORAL
  Filled 2018-01-16 (×3): qty 1

## 2018-01-16 MED ORDER — ACETAMINOPHEN 10 MG/ML IV SOLN
INTRAVENOUS | Status: DC | PRN
  Administered 2018-01-16: 1000 mg via INTRAVENOUS

## 2018-01-16 MED ORDER — PROPOFOL 10 MG/ML IV BOLUS
INTRAVENOUS | Status: AC
  Filled 2018-01-16: qty 20

## 2018-01-16 MED ORDER — ONDANSETRON HCL 4 MG/2ML IJ SOLN
INTRAMUSCULAR | Status: DC | PRN
  Administered 2018-01-16: 4 mg via INTRAVENOUS

## 2018-01-16 MED ORDER — MEPERIDINE HCL 50 MG/ML IJ SOLN: 6.2500 mg | INTRAMUSCULAR | Status: DC | PRN

## 2018-01-16 MED ORDER — SUCCINYLCHOLINE CHLORIDE 20 MG/ML IJ SOLN
INTRAMUSCULAR | Status: AC
  Filled 2018-01-16: qty 1

## 2018-01-16 MED ORDER — NALBUPHINE HCL 10 MG/ML IJ SOLN: 5.0000 mg | INTRAMUSCULAR | Status: DC | PRN

## 2018-01-16 MED ORDER — SODIUM CHLORIDE 0.9% IV SOLUTION: Freq: Once | INTRAVENOUS | Status: DC

## 2018-01-16 MED ORDER — FENTANYL CITRATE (PF) 100 MCG/2ML IJ SOLN
INTRAMUSCULAR | Status: AC
  Filled 2018-01-16: qty 2

## 2018-01-16 MED ORDER — FENTANYL CITRATE (PF) 100 MCG/2ML IJ SOLN
INTRAMUSCULAR | Status: DC | PRN
  Administered 2018-01-16 (×4): 25 ug via EPIDURAL

## 2018-01-16 MED ORDER — ACETAMINOPHEN 10 MG/ML IV SOLN
INTRAVENOUS | Status: AC
  Filled 2018-01-16: qty 100

## 2018-01-16 MED ORDER — DIPHENHYDRAMINE HCL 25 MG PO CAPS: 25.0000 mg | ORAL_CAPSULE | ORAL | Status: DC | PRN

## 2018-01-16 MED ORDER — WITCH HAZEL-GLYCERIN EX PADS: 1.0000 "application " | MEDICATED_PAD | CUTANEOUS | Status: DC | PRN

## 2018-01-16 MED ORDER — LACTATED RINGERS IV SOLN
INTRAVENOUS | Status: DC
  Administered 2018-01-16 (×2): via INTRAVENOUS

## 2018-01-16 MED ORDER — ACETAMINOPHEN 325 MG PO TABS: 650.0000 mg | ORAL_TABLET | ORAL | Status: DC | PRN

## 2018-01-16 MED ORDER — LIDOCAINE HCL (PF) 2 % IJ SOLN
INTRAMUSCULAR | Status: DC | PRN
  Administered 2018-01-16 (×2): 8 mg via INTRADERMAL
  Administered 2018-01-16: 12 mg via INTRADERMAL
  Administered 2018-01-16 (×2): 8 mg via INTRADERMAL
  Administered 2018-01-16 (×2): 12 mg via INTRADERMAL
  Administered 2018-01-16: 8 mg via INTRADERMAL
  Administered 2018-01-16: 12 mg via INTRADERMAL

## 2018-01-16 MED ORDER — HYDROMORPHONE HCL 1 MG/ML IJ SOLN
1.0000 mg | INTRAMUSCULAR | Status: DC | PRN
  Administered 2018-01-16 – 2018-01-17 (×3): 1 mg via INTRAVENOUS
  Filled 2018-01-16 (×3): qty 1

## 2018-01-16 MED ORDER — COCONUT OIL OIL: 1.0000 "application " | TOPICAL_OIL | Status: DC | PRN

## 2018-01-16 MED ORDER — PHENYLEPHRINE HCL 10 MG/ML IJ SOLN
INTRAMUSCULAR | Status: AC
  Filled 2018-01-16: qty 1

## 2018-01-16 MED ORDER — ONDANSETRON HCL 4 MG PO TABS: 4.0000 mg | ORAL_TABLET | ORAL | Status: DC | PRN

## 2018-01-16 MED ORDER — NALOXONE HCL 0.4 MG/ML IJ SOLN: 0.4000 mg | INTRAMUSCULAR | Status: DC | PRN

## 2018-01-16 MED ORDER — SUCCINYLCHOLINE CHLORIDE 20 MG/ML IJ SOLN
INTRAMUSCULAR | Status: DC | PRN
  Administered 2018-01-16: 100 mg via INTRAVENOUS

## 2018-01-16 MED ORDER — ONDANSETRON HCL 4 MG/2ML IJ SOLN: 4.0000 mg | INTRAMUSCULAR | Status: DC | PRN

## 2018-01-16 MED ORDER — DIPHENHYDRAMINE HCL 25 MG PO CAPS: 25.0000 mg | ORAL_CAPSULE | Freq: Four times a day (QID) | ORAL | Status: DC | PRN

## 2018-01-16 MED ORDER — ONDANSETRON HCL 4 MG/2ML IJ SOLN: 4.0000 mg | Freq: Once | INTRAMUSCULAR | Status: DC | PRN

## 2018-01-16 MED ORDER — SIMETHICONE 80 MG PO CHEW
80.0000 mg | CHEWABLE_TABLET | Freq: Four times a day (QID) | ORAL | Status: DC | PRN
  Administered 2018-01-16 – 2018-01-18 (×3): 80 mg via ORAL
  Filled 2018-01-16 (×4): qty 1

## 2018-01-16 MED ORDER — DIBUCAINE 1 % RE OINT: 1.0000 "application " | TOPICAL_OINTMENT | RECTAL | Status: DC | PRN

## 2018-01-16 MED ORDER — BUPIVACAINE HCL (PF) 0.5 % IJ SOLN
INTRAMUSCULAR | Status: DC | PRN
  Administered 2018-01-16: 4.5 mg
  Administered 2018-01-16: 3 mg
  Administered 2018-01-16 (×2): 4.5 mg
  Administered 2018-01-16 (×4): 3 mg
  Administered 2018-01-16: 4.5 mg

## 2018-01-16 MED ORDER — KETOROLAC TROMETHAMINE 30 MG/ML IJ SOLN
30.0000 mg | Freq: Four times a day (QID) | INTRAMUSCULAR | Status: AC | PRN
  Administered 2018-01-16 – 2018-01-17 (×3): 30 mg via INTRAVENOUS
  Filled 2018-01-16 (×3): qty 1

## 2018-01-16 MED ORDER — LACTATED RINGERS IV SOLN: INTRAVENOUS | Status: DC

## 2018-01-16 MED ORDER — ROCURONIUM BROMIDE 100 MG/10ML IV SOLN
INTRAVENOUS | Status: DC | PRN
  Administered 2018-01-16: 10 mg via INTRAVENOUS
  Administered 2018-01-16: 50 mg via INTRAVENOUS
  Administered 2018-01-16: 20 mg via INTRAVENOUS

## 2018-01-16 MED ORDER — SODIUM CHLORIDE 0.9 % IV SOLN
1.5000 g | Freq: Once | INTRAVENOUS | Status: AC
  Administered 2018-01-16: 1.5 g via INTRAVENOUS
  Filled 2018-01-16 (×2): qty 1.5

## 2018-01-16 MED ORDER — ONDANSETRON HCL 4 MG/2ML IJ SOLN
INTRAMUSCULAR | Status: AC
  Filled 2018-01-16: qty 2

## 2018-01-16 MED ORDER — SENNOSIDES-DOCUSATE SODIUM 8.6-50 MG PO TABS
2.0000 | ORAL_TABLET | ORAL | Status: DC
  Administered 2018-01-18: 2 via ORAL
  Filled 2018-01-16 (×2): qty 2

## 2018-01-16 MED ORDER — MENTHOL 3 MG MT LOZG
1.0000 | LOZENGE | OROMUCOSAL | Status: DC | PRN
  Administered 2018-01-16: 3 mg via ORAL
  Filled 2018-01-16 (×2): qty 9

## 2018-01-16 MED ORDER — LIDOCAINE HCL (CARDIAC) PF 100 MG/5ML IV SOSY
PREFILLED_SYRINGE | INTRAVENOUS | Status: DC | PRN
  Administered 2018-01-16: 100 mg via INTRAVENOUS

## 2018-01-16 MED ORDER — NALOXONE HCL 4 MG/10ML IJ SOLN: 1.0000 ug/kg/h | INTRAVENOUS | Status: DC | PRN

## 2018-01-16 MED ORDER — ROCURONIUM BROMIDE 50 MG/5ML IV SOLN
INTRAVENOUS | Status: AC
  Filled 2018-01-16: qty 1

## 2018-01-16 MED ORDER — SUGAMMADEX SODIUM 200 MG/2ML IV SOLN
INTRAVENOUS | Status: AC
  Filled 2018-01-16: qty 2

## 2018-01-16 MED ORDER — MIDAZOLAM HCL 2 MG/2ML IJ SOLN
INTRAMUSCULAR | Status: DC | PRN
  Administered 2018-01-16: 2 mg via INTRAVENOUS

## 2018-01-16 MED ORDER — PRENATAL MULTIVITAMIN CH
1.0000 | ORAL_TABLET | Freq: Every day | ORAL | Status: DC
  Administered 2018-01-16 – 2018-01-17 (×2): 1 via ORAL
  Filled 2018-01-16 (×2): qty 1

## 2018-01-16 MED ORDER — CEFAZOLIN SODIUM-DEXTROSE 2-3 GM-%(50ML) IV SOLR
INTRAVENOUS | Status: DC | PRN
  Administered 2018-01-16: 2 g via INTRAVENOUS

## 2018-01-16 MED ORDER — KETAMINE HCL 50 MG/ML IJ SOLN
INTRAMUSCULAR | Status: AC
  Filled 2018-01-16: qty 10

## 2018-01-16 MED ORDER — DEXAMETHASONE SODIUM PHOSPHATE 10 MG/ML IJ SOLN
INTRAMUSCULAR | Status: DC | PRN
  Administered 2018-01-16: 10 mg via INTRAVENOUS

## 2018-01-16 MED ORDER — LACTATED RINGERS IV SOLN
INTRAVENOUS | Status: DC | PRN
  Administered 2018-01-16 (×3): via INTRAVENOUS

## 2018-01-16 MED ORDER — CEFAZOLIN SODIUM-DEXTROSE 2-4 GM/100ML-% IV SOLN: 2.0000 g | INTRAVENOUS | Status: DC

## 2018-01-16 MED ORDER — PHENYLEPHRINE HCL 10 MG/ML IJ SOLN
INTRAMUSCULAR | Status: DC | PRN
  Administered 2018-01-16 (×12): 100 ug via INTRAVENOUS

## 2018-01-16 SURGICAL SUPPLY — 38 items
BAG COUNTER SPONGE EZ (MISCELLANEOUS) ×4 IMPLANT
CATH ROBINSON RED A/P 16FR (CATHETERS) ×2 IMPLANT
DERMABOND ADVANCED (GAUZE/BANDAGES/DRESSINGS) ×1
DERMABOND ADVANCED .7 DNX12 (GAUZE/BANDAGES/DRESSINGS) ×1 IMPLANT
DRSG OPSITE POSTOP 4X10 (GAUZE/BANDAGES/DRESSINGS) ×2 IMPLANT
ELECT BLADE 6.5 EXT (BLADE) ×2 IMPLANT
ELECT CAUTERY BLADE 6.4 (BLADE) ×4 IMPLANT
FILTER UTR ASPR SPEC (MISCELLANEOUS) IMPLANT
FLTR UTR ASPR SPEC (MISCELLANEOUS)
GLOVE BIOGEL PI IND STRL 6.5 (GLOVE) ×8 IMPLANT
GLOVE BIOGEL PI INDICATOR 6.5 (GLOVE) ×8
GLOVE SURG SYN 6.5 ES PF (GLOVE) ×16 IMPLANT
GOWN STRL REUS W/ TWL LRG LVL3 (GOWN DISPOSABLE) ×6 IMPLANT
GOWN STRL REUS W/TWL LRG LVL3 (GOWN DISPOSABLE) ×6
HANDLE YANKAUER SUCT BULB TIP (MISCELLANEOUS) ×2 IMPLANT
KIT BERKELEY 1ST TRIMESTER 3/8 (MISCELLANEOUS) IMPLANT
KIT TURNOVER CYSTO (KITS) ×2 IMPLANT
LIGASURE IMPACT 36 18CM CVD LR (INSTRUMENTS) ×2 IMPLANT
NS IRRIG 1000ML POUR BTL (IV SOLUTION) ×2 IMPLANT
NS IRRIG 500ML POUR BTL (IV SOLUTION) ×2 IMPLANT
PACK BASIN MAJOR ARMC (MISCELLANEOUS) ×2 IMPLANT
PACK CATARACT BRASINGTON LX (MISCELLANEOUS) ×2 IMPLANT
PACK DNC HYST (MISCELLANEOUS) ×2 IMPLANT
PACK GYN LAPAROSCOPIC (MISCELLANEOUS) ×2 IMPLANT
PAD OB MATERNITY 4.3X12.25 (PERSONAL CARE ITEMS) ×2 IMPLANT
PAD PREP 24X41 OB/GYN DISP (PERSONAL CARE ITEMS) ×2 IMPLANT
PENCIL ELECTRO HAND CTR (MISCELLANEOUS) ×2 IMPLANT
SET BERKELEY SUCTION TUBING (SUCTIONS) IMPLANT
SPONGE LAP 18X18 RF (DISPOSABLE) ×2 IMPLANT
SUT VIC AB 0 CT1 27 (SUTURE) ×4
SUT VIC AB 0 CT1 27XCR 8 STRN (SUTURE) ×4 IMPLANT
SUT VIC AB 0 CT1 36 (SUTURE) ×6 IMPLANT
TOWEL OR 17X26 4PK STRL BLUE (TOWEL DISPOSABLE) ×2 IMPLANT
TRAY FOLEY MTR SLVR 16FR STAT (SET/KITS/TRAYS/PACK) ×2 IMPLANT
VACURETTE 10 RIGID CVD (CANNULA) IMPLANT
VACURETTE 12 RIGID CVD (CANNULA) IMPLANT
VACURETTE 8 RIGID CVD (CANNULA) IMPLANT
VACURETTE 8MM F TIP (MISCELLANEOUS) IMPLANT

## 2018-01-16 NOTE — Transfer of Care (Signed)
Immediate Anesthesia Transfer of Care Note  Patient: Desiree Mosley  Procedure(s) Performed: ,D&C,abdominal hysterectomy (N/A )  Patient Location: PACU  Anesthesia Type:General  Level of Consciousness: awake, alert  and oriented  Airway & Oxygen Therapy: Patient Spontanous Breathing and Patient connected to nasal cannula oxygen  Post-op Assessment: Report given to RN and Post -op Vital signs reviewed and stable  Post vital signs: Reviewed and stable  Last Vitals:  Vitals Value Taken Time  BP 118/64 01/16/2018 11:34 AM  Temp    Pulse 83 01/16/2018 11:34 AM  Resp 16 01/16/2018 11:34 AM  SpO2 100 % 01/16/2018 11:34 AM  Vitals shown include unvalidated device data.  Last Pain:  Vitals:   01/16/18 0434  TempSrc: Oral  PainSc:          Complications: No apparent anesthesia complications

## 2018-01-16 NOTE — Anesthesia Post-op Follow-up Note (Signed)
Anesthesia QCDR form completed.        

## 2018-01-16 NOTE — Discharge Instructions (Signed)
°Vaginal Delivery, Care After °Refer to this sheet in the next few weeks. These discharge instructions provide you with information on caring for yourself after delivery. Your caregiver may also give you specific instructions. Your treatment has been planned according to the most current medical practices available, but problems sometimes occur. Call your caregiver if you have any problems or questions after you go home. °HOME CARE INSTRUCTIONS °1. Take over-the-counter or prescription medicines only as directed by your caregiver or pharmacist. °2. Do not drink alcohol, especially if you are breastfeeding or taking medicine to relieve pain. °3. Do not smoke tobacco. °4. Continue to use good perineal care. Good perineal care includes: °1. Wiping your perineum from back to front °2. Keeping your perineum clean. °3. You can do sitz baths twice a day, to help keep this area clean °5. Do not use tampons, douche or have sex for 6 weeks °6. Shower only and avoid sitting in submerged water, aside from sitz baths °7. Wear a well-fitting bra that provides breast support. °8. Eat healthy foods. °9. Drink enough fluids to keep your urine clear or pale yellow. °10. Eat high-fiber foods such as whole grain cereals and breads, brown rice, beans, and fresh fruits and vegetables every day. These foods may help prevent or relieve constipation. °11. Avoid constipation with high fiber foods or medications, such as miralax or metamucil °12. Follow your caregiver's recommendations regarding resumption of activities such as climbing stairs, driving, lifting, exercising, or traveling. °13. Talk to your caregiver about resuming sexual activities. Resumption of sexual activities after 6 weeks is dependent upon your risk of infection, your rate of healing, and your comfort and desire to resume sexual activity. °14. Try to have someone help you with your household activities and your newborn for at least a few days after you leave the  hospital. °15. Rest as much as possible. Try to rest or take a nap when your newborn is sleeping. °16. Increase your activities gradually. °17. Keep all of your scheduled postpartum appointments. It is very important to keep your scheduled follow-up appointments. At these appointments, your caregiver will be checking to make sure that you are healing physically and emotionally. °SEEK MEDICAL CARE IF:  °· You are passing large clots from your vagina. Save any clots to show your caregiver. °· You have a foul smelling discharge from your vagina. °· You have trouble urinating. °· You are urinating frequently. °· You have pain when you urinate. °· You have a change in your bowel movements. °· You have increasing redness, pain, or swelling near your vaginal incision (episiotomy) or vaginal tear. °· You have pus draining from your episiotomy or vaginal tear. °· Your episiotomy or vaginal tear is separating. °· You have painful, hard, or reddened breasts. °· You have a severe headache. °· You have blurred vision or see spots. °· You feel sad or depressed. °· You have thoughts of hurting yourself or your newborn. °· You have questions about your care, the care of your newborn, or medicines. °· You are dizzy or light-headed. °· You have a rash. °· You have nausea or vomiting. °· You were breastfeeding and have not had a menstrual period within 12 weeks after you stopped breastfeeding. °· You are not breastfeeding and have not had a menstrual period by the 12th week after delivery. °· You have a fever of 100.5 or more °SEEK IMMEDIATE MEDICAL CARE IF:  °· You have persistent pain. °· You have chest pain. °· You have shortness   of breath. °· You faint. °· You have leg pain. °· You have stomach pain. °· Your vaginal bleeding saturates two or more sanitary pads in 1 hour. °MAKE SURE YOU:  °· Understand these instructions. °· Will watch your condition. °· Will get help right away if you are not doing well or get worse. °Document  Released: 12/21/1999 Document Revised: 05/09/2013 Document Reviewed: 08/20/2011 °Exit[employer]® Patient Information ©2015 [employer]. This information is not intended to replace advice given to you by your health care provider. Make sure you discuss any questions you have with your health care provider. ° °Sitz Bath °A sitz bath is a warm water bath taken in the sitting position. The water covers only the hips and butt (buttocks). We recommend using one that fits in the toilet, to help with ease of use and cleanliness. It may be used for either healing or cleaning purposes. Sitz baths are also used to relieve pain, itching, or muscle tightening (spasms). The water may contain medicine. Moist heat will help you heal and relax.  °HOME CARE  °Take 3 to 4 sitz baths a day. °18. Fill the bathtub half-full with warm water. °19. Sit in the water and open the drain a little. °20. Turn on the warm water to keep the tub half-full. Keep the water running constantly. °21. Soak in the water for 15 to 20 minutes. °22. After the sitz bath, pat the affected area dry. °GET HELP RIGHT AWAY IF: °You get worse instead of better. Stop the sitz baths if you get worse. °MAKE SURE YOU: °· Understand these instructions. °· Will watch your condition. °· Will get help right away if you are not doing well or get worse. °Document Released: 01/31/2004 Document Revised: 09/17/2011 Document Reviewed: 04/22/2010 °ExitCare® Patient Information ©2015 [employer]. This information is not intended to replace advice given to you by your health care provider. Make sure you discuss any questions you have with your health care provider. ° ° °

## 2018-01-16 NOTE — Plan of Care (Signed)
Alert and Oriented with aprop. Affect. Color good, skin w&d. BBS clear. Assessment is WNL; see Assessment Flow -sheet. Instructed Call Don't Fall Policy and to call for assistance when she wants to get OOB. PT. V/O.

## 2018-01-16 NOTE — Progress Notes (Signed)
L&D Progress Note   S: Feeling much better. Lightheadedness and nausea resolved.  O: Problems with hypotension after epidural did not resolve with fluid bolus, but did resolve with ephedrine 10 mgm IV. Current blood pressure 106/64.  Difficulty picking up contractions with external monitor. Had been every 2-5 minutes prior to epidural with Pitocin at 4 miu/min Cervix: 4/50%/-1 to -2 FHR: 135 with accelerations to 160s to 170s, moderate varaibility, occasional early deceleration. AROM: clear fluid/ had more bloody show. IUPC inserted  A: IOL in progress FWB: reassuring FH strip  P: Titrate Pitocin to get 200-250 mvus Monitor for start of second stage.  Desiree Mosley, CNM

## 2018-01-16 NOTE — Discharge Summary (Signed)
Physician Obstetric Discharge Summary  Patient ID: Desiree Mosley MRN: [id number] DOB/AGE: 1985-01-10 33 y.o.   Date of Admission: 01/15/2018  Date of Discharge: 01/18/2018  Admitting Diagnosis: Induction of labor at 3425w1d  Secondary Diagnosis: Anemia in pregnancy  Mode of Delivery: normal spontaneous vaginal delivery 01/16/2018      Discharge Diagnosis: Term vaginal delivery, anemia of pregnancy, Retained placenta   Intrapartum Procedures: Atificial rupture of membranes, epidural, placement of intrauterine catheter and Cytotec induction, Pitocin augmentation   Post partum procedures: Abdominal hysterectomy for adherent placenta  Complications:  Retained placenta   Brief Hospital Course  Desiree Mosley is a [id number] who had a SVD on 01/16/2018 after elective induction;  for further details of this delivery, please refer to the delivery note.  Patient had a retained placenta and was taken to the OR and an abdominal hysterectomy was performed after determining that the placenta was abnormally adherent (see operative note for details).   By time of discharge on PPD#2, her pain was controlled on oral pain medications; she had appropriate lochia and was ambulating, voiding without difficulty and tolerating regular diet.  She was deemed stable for discharge to home.    Labs: CBC Latest Ref Rng & Units 01/17/2018 01/16/2018 01/16/2018  WBC 4.0 - 10.5 K/uL 12.4(H) 15.1(H) 15.1(H)  Hemoglobin 12.0 - 15.0 g/dL 1.4(N8.8(L) 8.2(N9.6(L) 7.8(L)  Hematocrit 36.0 - 46.0 % 27.8(L) 29.8(L) 24.9(L)  Platelets 150 - 400 K/uL 132(L) 151 135(L)   O POS  Physical exam:  Blood pressure 111/82, pulse 80, temperature 97.9 F (36.6 C), temperature source Oral, resp. rate 18, height 5\' 6"  (1.676 m), weight 86.2 kg, last menstrual period 03/23/2017, SpO2 99 %, unknown if currently breastfeeding. General: alert and no distress Lochia: appropriate Abdomen: soft, NT Uterine Fundus: firm Incision: Dressing  C/D/I Extremities: No evidence of DVT seen on physical exam. No lower extremity edema.  Discharge Instructions: Per After Visit Summary. Activity: Advance as tolerated. Pelvic rest for 6 weeks.  Also refer to Discharge Instructions Diet: Regular Medications: Allergies as of 01/18/2018   No Known Allergies     Medication List    TAKE these medications   ferrous sulfate 325 (65 FE) MG tablet Take 1 tablet (325 mg total) by mouth 2 (two) times daily with a meal.   oxyCODONE-acetaminophen 5-325 MG tablet Commonly known as:  PERCOCET/ROXICET Take 1-2 tablets by mouth every 6 (six) hours as needed.            Discharge Care Instructions  (From admission, onward)         Start     Ordered   01/18/18 0000  Discharge wound care:    Comments:  You may apply a light dressing for minor discharge from the incision or to keep waistbands of clothing from rubbing.  You may also have been discharge with a clear dressing in which case this will be removed at your postoperative clinic visit.  You may shower, use soap on your incision.  Avoid baths or soaking the incision in the first 6 weeks following your surgery.Marland Kitchen.   01/18/18 0912         Outpatient follow up:  Follow-up Information    Farrel ConnersGutierrez, Colleen, CNM. Schedule an appointment as soon as possible for a visit.   Specialty:  Certified Nurse Midwife Why:  for 6 week check up Contact information: [address] [place] [id number] [phone]        Natale MilchSchuman, Christanna R, MD. Schedule an appointment as  soon as possible for a visit in 1 week(s).   Specialty:  Obstetrics and Gynecology Why:  Post operative check Contact information: [address] [place] [address] [phone]          Postpartum contraception: Hysterectomy  Discharged Condition: good  Discharged to: home   Newborn Data: Ladona Ridgel 8#13oz Disposition:home with mother  Apgars: APGAR (1 MIN): 9   APGAR (5 MINS): 9    Baby Feeding:  Formula  Oswaldo Conroy, CNM 01/18/2018

## 2018-01-16 NOTE — Op Note (Signed)
Operative Note  01/16/2018  PRE-OP DIAGNOSIS:  Retained Placenta   POST-OP DIAGNOSIS: Retained Placenta, Placenta Accreta   PROCEDURE: Procedure(s): Exam under anesthesia. Abdominal supracervical hysterectomy   SURGEON: Adelene Idler MD  ASSISTANT: Thomasene Mohair MD- No other qualified assistant available.   ANESTHESIA: General   ESTIMATED BLOOD LOSS: 1500 cc  SPECIMENS:  Uterus and placenta  COMPLICATIONS: Hemorrhage  DISPOSITION: PACU - hemodynamically stable.  CONDITION: stable  FINDINGS: Exam under anesthesia revealed . Intrabdominal survey revealed grossly normal bilateral ovaries and fallopian tubes. Enlarged uterus.   Situation: Called by midwife Karren Cobble for retained placenta more than 30 minutes. I came to evaluate the patient. Placenta was still attached to the anterior and fundal portion of the uterus. I attempted to manually remove it in the room. She had adequate anesthesia with her epidural. Attempted manual removal was not successful. A plane could not be developed between the uterus and the placenta. Bedside US was performed and was concerning for a possible accreta vs. Increta. Discussed with the patient and the father of her baby in the room that we would need to go to the OR to attempt to remove the placenta. Discussed the possibility of a hysterectomy with the patient if the placenta was not able to be removed. Discussed risk of hemorrhage and blood transfusion. Dr. Jean Rosenthal was called for assistance.     PROCEDURE IN DETAIL: After informed consent was obtained, the patient was taken to the operating room where general endotracheal anesthesia was performed without difficulty. The patient was positioned in the lithotomy position with her feet in [place] stirrups. She was prepped and draped in a sterile fashion. Exam under anesthesia was performed. The placenta was very adherent to the anterior and fundal uterine wall. A plane could not be developed  between the placenta and the uterine wall. Small portions of the placenta were removed, but a significant amount of the placenta was adherent to the uterus. Despite traction the the umbilical cord and massage the placenta did not release. Bedside US was performed and the placenta appeared to be within millimeters of the uterine serosa. Dr. Jean Rosenthal also examined the patient and attempted removal of the placenta. He agreeded with the assessment that this was a possible accreta. The patient was having significant vaginal bleeding and passing large 100 cc clots. The decision was made to perform a cesarean hysterectomy.    She was re-positioned and re-preped and draped into the supine position and the usual precautions taken with her arms in arm boards bilaterally. Foley catheter was placed under sterile technique into the bladder. Time-out was performed again. A pfannenstiel incision was made and carried down to the underlying fascia. The fascia was dissected bilaterally using mayo scissors, and then the rectus fascia was dissected away from the muscle and separated in the midline. The peritoneum was then entered. The uterus was identified and delivered through the abdominal incision. The bladder blade was inserted.   The round ligaments were then suture ligated and divided on each side with electrocautery.  The utero ovarian ligaments were skeletonized, divided, then suture ligated. The bladder flap was created  and the bladder was dissected down off the lower uterine segment and cervix. The uterine arteries were then skeletonized bilaterally clamped, cut, and divided, then suture ligated. The hand held ligasure device was also used to help ensure hemostasis.   The specimen was then amputated from the uterus using the bovie cautery. The anterior and posterior cervical walls were grasped with Allis clamps.  The cervical stump was oversewn with two layers of 0-vicryl with a running stitch.   After the abdomen was  irrigated and all pedicles were inspected and hemostatic the peritoneum was closed with a running suture of 2-0 vicryl suture, and the fascia was then reapproximated in a running suture with 0-Vicryl.  The subcutaneous space was then irrigated, and hemostasis assured using electrocautery. The subcutaneous fat was approximated with 3-0 plain. The skin was closed with 4-0 monocryl and surgical glue.  Sponge, lap, needle and instrument counts were correct x2 at the end of the procedure. Patient was taken to the recovery room in stable condition. Because of blood loss during the surgery and her low initial hemoglobin she received 4 units of PRBC intraoperatively. Intraoperative hemoglobin was 7.8 after the first 2 units of blood.   Adelene Idler MD [facility], [facility] 01/16/2018  11:59 AM

## 2018-01-16 NOTE — Progress Notes (Signed)
Called by midwife to evaluate patient for retained placenta. Delivery of placenta attempted by fundal massage and pressure applied to umbilical cord. Placenta remained adherent to uterine wall. Bimanual exam apply to be performed because patient had good epidural coverage. Placenta very adherent to uterine wall. A clear plane of tissue could not be identified. Ultrasound performed at bedside. Placenta adherent to the uterine wall. Possible uterine accreta/increta. Patient reports that her last placenta was also difficult to deliver. We discussed the risks of uterine perforation, hemorrhage, infection, and emergent hysterectomy associated with this procedure. Patient stated understanding.  Would be okay with any life-saving measures including blood transfusion and hysterectomy.   Adelene Idler MD [facility], [facility] 01/16/2018 7:44 AM

## 2018-01-16 NOTE — Progress Notes (Signed)
Late Entry: Called to the OR to help with evaluation of retained placenta for this patient.  I assisted initially by helping perform a bedside ultrasound evaluation of the placenta by performing a transabdominal ultrasound.  The ultrasound was not definitive. However, there were areas conspicuous for a very thin layer of myometrium where the placenta was still attached.   I then scrubbed in and performed a manual assessment of the placenta.  There was only a small amount of area where a plane could be found between the placenta and the uterus.  The other portions of the placenta could not be separated from the uterine wall. Additionally, there was brisk bleeding already coming from the uterus.  It was also my assessment that the placenta was abnormally adherent to the uterus and that an attempt to forcibly removed the placenta from the uterus might result in severe and profuse bleeding in a patient that already had lost quite a bit of blood already with low starting blood counts.  Further, if there had been a reasonable expectation that the placenta could be safely removed, there was also risk in performing a D&C given the very thin-appearing uterine wall on ultrasound with possible resultant perforation of the uterus trying to utilize a curette.   I then went on to assist Dr. Jerene Pitch with the hysterectomy, as this appeared to be indicated and the safest route for the patient.  See her operative report for full details.  Thomasene Mohair, MD, [facility], [facility] 01/16/2018 1:08 PM

## 2018-01-16 NOTE — Anesthesia Preprocedure Evaluation (Signed)
Anesthesia Evaluation  Patient identified by MRN, date of birth, ID band Patient awake    Reviewed: Allergy & Precautions, H&P , NPO status , Patient's Chart, lab work & pertinent test results  Airway Mallampati: II  TM Distance: >3 FB     Dental no notable dental hx. (+) Teeth Intact   Pulmonary neg pulmonary ROS, Current Smoker, former smoker,    Pulmonary exam normal breath sounds clear to auscultation       Cardiovascular Exercise Tolerance: Good negative cardio ROS       Neuro/Psych negative neurological ROS  negative psych ROS   GI/Hepatic negative GI ROS, Neg liver ROS,   Endo/Other  negative endocrine ROSdiabetes, Gestational  Renal/GU      Musculoskeletal   Abdominal   Peds  Hematology negative hematology ROS (+)   Anesthesia Other Findings   Reproductive/Obstetrics (+) Pregnancy                             Anesthesia Physical  Anesthesia Plan  ASA: II and emergent  Anesthesia Plan: General   Post-op Pain Management:    Induction: Rapid sequence, Cricoid pressure planned and Intravenous  PONV Risk Score and Plan: 3 and Dexamethasone, Ondansetron and Midazolam  Airway Management Planned: Oral ETT  Additional Equipment:   Intra-op Plan:   Post-operative Plan:   Informed Consent: I have reviewed the patients History and Physical, chart, labs and discussed the procedure including the risks, benefits and alternatives for the proposed anesthesia with the patient or authorized representative who has indicated his/her understanding and acceptance.     Plan Discussed with:   Anesthesia Plan Comments:         Anesthesia Quick Evaluation

## 2018-01-16 NOTE — Anesthesia Postprocedure Evaluation (Signed)
Anesthesia Post Note  Patient: Desiree Mosley  Procedure(s) Performed: ,D&C,abdominal hysterectomy (N/A )  Patient location during evaluation: PACU Anesthesia Type: General Level of consciousness: awake and alert Pain management: pain level controlled Vital Signs Assessment: post-procedure vital signs reviewed and stable Respiratory status: spontaneous breathing and respiratory function stable Cardiovascular status: stable Anesthetic complications: no     Last Vitals:  Vitals:   01/16/18 1204 01/16/18 1219  BP: 112/69 109/68  Pulse: 82 80  Resp: 14 13  Temp: 36.9 C   SpO2: 100% 100%    Last Pain:  Vitals:   01/16/18 1219  TempSrc:   PainSc: Asleep                 Anesia Blackwell K

## 2018-01-16 NOTE — Anesthesia Procedure Notes (Signed)
Procedure Name: Intubation Date/Time: 01/16/2018 8:13 AM Performed by: Estanislado Emms, CRNA Pre-anesthesia Checklist: Patient identified, Patient being monitored, Timeout performed, Emergency Drugs available and Suction available Patient Re-evaluated:Patient Re-evaluated prior to induction Oxygen Delivery Method: Circle system utilized Preoxygenation: Pre-oxygenation with 100% oxygen Induction Type: IV induction, Rapid sequence and Cricoid Pressure applied Laryngoscope Size: Miller and 2 Grade View: Grade I Tube type: Oral Tube size: 6.5 mm Number of attempts: 1 Airway Equipment and Method: Stylet Placement Confirmation: ETT inserted through vocal cords under direct vision,  positive ETCO2 and breath sounds checked- equal and bilateral Secured at: 21 cm Tube secured with: Tape Dental Injury: Teeth and Oropharynx as per pre-operative assessment

## 2018-01-16 NOTE — Progress Notes (Signed)
Verified with anesthesia (Dr. Henrene Hawking) pt did NOT receive any narcotics through her epidural therefore patient can have any pain medications ordered and does not need to be on continuous pulse oximetry.

## 2018-01-17 ENCOUNTER — Encounter: Payer: Self-pay | Admitting: Obstetrics and Gynecology

## 2018-01-17 LAB — TYPE AND SCREEN
ABO/RH(D): O POS
Antibody Screen: NEGATIVE
Unit division: 0
Unit division: 0
Unit division: 0
Unit division: 0
Unit division: 0
Unit division: 0

## 2018-01-17 LAB — BPAM RBC
Blood Product Expiration Date: 202002162359
Blood Product Expiration Date: 202002162359
Blood Product Expiration Date: 202002162359
Blood Product Expiration Date: 202002162359
Blood Product Expiration Date: 202002162359
Blood Product Expiration Date: 202002162359
ISSUE DATE / TIME: 202001110840
ISSUE DATE / TIME: 202001110840
ISSUE DATE / TIME: 202001111028
ISSUE DATE / TIME: 202001111028
Unit Type and Rh: 5100
Unit Type and Rh: 5100
Unit Type and Rh: 5100
Unit Type and Rh: 5100
Unit Type and Rh: 5100
Unit Type and Rh: 5100

## 2018-01-17 LAB — CBC
HCT: 27.8 % — ABNORMAL LOW (ref 36.0–46.0)
Hemoglobin: 8.8 g/dL — ABNORMAL LOW (ref 12.0–15.0)
MCH: 27.4 pg (ref 26.0–34.0)
MCHC: 31.7 g/dL (ref 30.0–36.0)
MCV: 86.6 fL (ref 80.0–100.0)
Platelets: 132 10*3/uL — ABNORMAL LOW (ref 150–400)
RBC: 3.21 MIL/uL — ABNORMAL LOW (ref 3.87–5.11)
RDW: 15.9 % — ABNORMAL HIGH (ref 11.5–15.5)
WBC: 12.4 10*3/uL — ABNORMAL HIGH (ref 4.0–10.5)
nRBC: 0 % (ref 0.0–0.2)

## 2018-01-17 LAB — BASIC METABOLIC PANEL
Anion gap: 4 — ABNORMAL LOW (ref 5–15)
BUN: 6 mg/dL (ref 6–20)
CO2: 25 mmol/L (ref 22–32)
Calcium: 7.6 mg/dL — ABNORMAL LOW (ref 8.9–10.3)
Chloride: 107 mmol/L (ref 98–111)
Creatinine, Ser: 0.65 mg/dL (ref 0.44–1.00)
GFR calc Af Amer: >60 mL/min (ref >60)
GFR calc non Af Amer: >60 mL/min (ref >60)
Glucose, Bld: 115 mg/dL — ABNORMAL HIGH (ref 70–99)
Potassium: 3.8 mmol/L (ref 3.5–5.1)
Sodium: 136 mmol/L (ref 135–145)

## 2018-01-17 MED ORDER — IBUPROFEN 600 MG PO TABS
600.0000 mg | ORAL_TABLET | Freq: Four times a day (QID) | ORAL | Status: DC
  Administered 2018-01-17 – 2018-01-18 (×4): 600 mg via ORAL
  Filled 2018-01-17 (×4): qty 1

## 2018-01-17 NOTE — Anesthesia Postprocedure Evaluation (Signed)
Anesthesia Post Note  Patient: Desiree Mosley  Procedure(s) Performed: AN AD HOC LABOR EPIDURAL  Patient location during evaluation: Mother Baby Anesthesia Type: Epidural Level of consciousness: awake and alert Pain management: pain level controlled Vital Signs Assessment: post-procedure vital signs reviewed and stable Respiratory status: spontaneous breathing, nonlabored ventilation and respiratory function stable Cardiovascular status: stable Postop Assessment: no headache, no backache and epidural receding Anesthetic complications: no     Last Vitals:  Vitals:   01/17/18 0325 01/17/18 0800  BP: 107/69 105/70  Pulse: 88 75  Resp: 18 18  Temp: 36.7 C 36.9 C  SpO2: 99% 98%    Last Pain:  Vitals:   01/17/18 0858  TempSrc:   PainSc: 3                  KEPHART,WILLIAM K

## 2018-01-17 NOTE — Plan of Care (Signed)
VSS.  Alert and oriented with pleasant affect. Lower Abd. Dressing D & I. States effective Pain control with scheduled Ibuprofen and PRN Percocet. Demonstrates ability to care for Self. V/O of Fall Prevention.

## 2018-01-17 NOTE — Progress Notes (Signed)
Subjective:   Post Op Day 1: SVD, retained placenta, hysterectomy. Patient is still on scheduled Toradol until 9 am. She has some sharp incisional pain and has just received a dose of percocet. Her foley catheter is out but she has not yet ambulated to the toilet. She has an appetite and is going to order breakfast. She denies feeling lightheaded or dizzy. She denies HA or shortness of breath.  Objective:  Blood pressure 105/70, pulse 75, temperature 98.4 F (36.9 C), temperature source Oral, resp. rate 18, height 5\' 6"  (1.676 m), weight 86.2 kg, last menstrual period 03/23/2017, SpO2 98 %  General: NAD Pulmonary: no increased work of breathing Abdomen: non-distended, non-tender, fundus firm at level of umbilicus Incision: C/D/I Extremities: no edema, no erythema, no tenderness  Results for orders placed or performed during the hospital encounter of 01/15/18 (from the past 24 hour(s))  Prepare RBC     Status: None   Collection Time: 01/16/18  8:36 AM  Result Value Ref Range   Order Confirmation      DUPLICATE REQUEST Performed at [facility], [address] [place], [place] [address]   Prepare RBC     Status: None   Collection Time: 01/16/18 10:17 AM  Result Value Ref Range   Order Confirmation      DUPLICATE REQUEST Performed at [facility], [address] [place], [place] [address]   CBC     Status: Abnormal   Collection Time: 01/16/18 10:24 AM  Result Value Ref Range   WBC 15.1 (H) 4.0 - 10.5 K/uL   RBC 2.90 (L) 3.87 - 5.11 MIL/uL   Hemoglobin 7.8 (L) 12.0 - 15.0 g/dL   HCT 67.8 (L) 93.8 - 10.1 %   MCV 85.9 80.0 - 100.0 fL   MCH 26.9 26.0 - 34.0 pg   MCHC 31.3 30.0 - 36.0 g/dL   RDW 75.1 (H) 02.5 - 85.2 %   Platelets 135 (L) 150 - 400 K/uL   nRBC 0.0 0.0 - 0.2 %  CBC     Status: Abnormal   Collection Time: 01/16/18  5:59 PM  Result Value Ref Range   WBC 15.1 (H) 4.0 - 10.5 K/uL   RBC 3.52 (L) 3.87 - 5.11 MIL/uL   Hemoglobin 9.6 (L) 12.0 -  15.0 g/dL   HCT 77.8 (L) 24.2 - 35.3 %   MCV 84.7 80.0 - 100.0 fL   MCH 27.3 26.0 - 34.0 pg   MCHC 32.2 30.0 - 36.0 g/dL   RDW 61.4 (H) 43.1 - 54.0 %   Platelets 151 150 - 400 K/uL   nRBC 0.0 0.0 - 0.2 %  Basic metabolic panel     Status: Abnormal   Collection Time: 01/17/18  5:26 AM  Result Value Ref Range   Sodium 136 135 - 145 mmol/L   Potassium 3.8 3.5 - 5.1 mmol/L   Chloride 107 98 - 111 mmol/L   CO2 25 22 - 32 mmol/L   Glucose, Bld 115 (H) 70 - 99 mg/dL   BUN 6 6 - 20 mg/dL   Creatinine, Ser 0.86 0.44 - 1.00 mg/dL   Calcium 7.6 (L) 8.9 - 10.3 mg/dL   GFR calc non Af Amer >60 >60 mL/min   GFR calc Af Amer >60 >60 mL/min   Anion gap 4 (L) 5 - 15    Intake/Output Summary (Last 24 hours) at 01/17/2018 0825 Last data filed at 01/17/2018 0615 Gross per 24 hour  Intake 6112.33 ml  Output  3675 ml  Net 2437.33 ml      Assessment:   33 y.o. [id number] postoperativeday # 1 s/p hysterectomy for adherent placenta   Plan:  1) Acute blood loss anemia - hemodynamically stable and asymptomatic - po ferrous sulfate  2) O positive, Rubella Immune, Varicella Immune  3) TDAP status: declined antepartum  4) Formula feeding  5) Contraception: hysterectomy  6) Disposition: continue routine post surgical and postpartum care   Tresea MallJane Destane Speas, CNM

## 2018-01-18 MED ORDER — OXYCODONE-ACETAMINOPHEN 5-325 MG PO TABS: 1.0000 | ORAL_TABLET | Freq: Four times a day (QID) | ORAL | 0 refills | Status: DC | PRN

## 2018-01-18 MED ORDER — FERROUS SULFATE 325 (65 FE) MG PO TABS: 325.0000 mg | ORAL_TABLET | Freq: Two times a day (BID) | ORAL | 3 refills | Status: DC

## 2018-01-18 NOTE — Progress Notes (Signed)
Dc to home.  To car via auxillary.  Pt verb u/o of dc instructions and meds to take at home

## 2018-01-19 ENCOUNTER — Encounter: Payer: Self-pay | Admitting: Certified Nurse Midwife

## 2018-01-19 ENCOUNTER — Other Ambulatory Visit: Payer: Self-pay | Admitting: Obstetrics and Gynecology

## 2018-01-19 DIAGNOSIS — K567 Ileus, unspecified: Secondary | ICD-10-CM

## 2018-01-19 DIAGNOSIS — O43229 Placenta increta, unspecified trimester: Secondary | ICD-10-CM | POA: Insufficient documentation

## 2018-01-19 DIAGNOSIS — K9189 Other postprocedural complications and disorders of digestive system: Principal | ICD-10-CM

## 2018-01-19 LAB — SURGICAL PATHOLOGY

## 2018-01-19 MED ORDER — METOCLOPRAMIDE HCL 10 MG PO TABS: 10.0000 mg | ORAL_TABLET | Freq: Three times a day (TID) | ORAL | 1 refills | Status: DC

## 2018-01-19 MED ORDER — SIMETHICONE 80 MG PO CHEW: 80.0000 mg | CHEWABLE_TABLET | Freq: Four times a day (QID) | ORAL | 1 refills | Status: DC | PRN

## 2018-01-19 NOTE — Progress Notes (Signed)
Called and discussed with patient, placenta increta

## 2018-01-19 NOTE — Progress Notes (Signed)
Discussed pathology result with patient on the phone. She is having difficulty having a bowel movement and reports some liquid but no formed stool. Small amount of flatulence.  Discussed diet of soft bland food with small meals. Discussed starting reglan and simethicone.   Asked patient to notify me if she has nausea and vomiting.   Pain not fully controlled. Taking percocet. Asked her to add in ibuprofen.   Adelene Idlerhristanna Schuman MD [facility], [facility] 01/19/2018 1:58 PM

## 2018-01-20 ENCOUNTER — Other Ambulatory Visit: Payer: Self-pay

## 2018-01-21 ENCOUNTER — Other Ambulatory Visit: Payer: Self-pay

## 2018-01-21 MED ORDER — OXYCODONE-ACETAMINOPHEN 5-325 MG PO TABS: 1.0000 | ORAL_TABLET | Freq: Four times a day (QID) | ORAL | 0 refills | Status: DC | PRN

## 2018-01-21 NOTE — Telephone Encounter (Signed)
Please advise 

## 2018-01-21 NOTE — Telephone Encounter (Signed)
advise

## 2018-01-23 ENCOUNTER — Other Ambulatory Visit: Payer: Self-pay

## 2018-01-25 ENCOUNTER — Ambulatory Visit (INDEPENDENT_AMBULATORY_CARE_PROVIDER_SITE_OTHER): Payer: Medicaid Other | Admitting: Obstetrics and Gynecology

## 2018-01-25 ENCOUNTER — Encounter: Payer: Self-pay | Admitting: Obstetrics and Gynecology

## 2018-01-25 DIAGNOSIS — Z90711 Acquired absence of uterus with remaining cervical stump: Secondary | ICD-10-CM

## 2018-01-25 NOTE — Progress Notes (Signed)
  OBSTETRICS POSTPARTUM CLINIC PROGRESS NOTE  Subjective:     Desiree Mosley is a 33 y.o. [phone] female who presents for a postpartum visit. She is 1 week postpartum following a Term pregnancy and delivery by Vaginal problems after delivery including increta and postpartum hysterectomy.  I have fully reviewed the prenatal and intrapartum course. Anesthesia: epidural and general.  Postpartum course has been complicated by complicated by acute blood loss anemia.  Baby is feeding by Bottle.  Bleeding: patient has not  resumed menses.  Bowel function is abnormal: constipation, no bowel movement since deliveyr. Bladder function is normal.  Patient is not sexually active. Contraception method desired is none.  Postpartum depression screening: negative.   The following portions of the patient's history were reviewed and updated as appropriate: allergies, current medications, past family history, past medical history, past social history, past surgical history and problem list.  Review of Systems Constitutional: negative Gastrointestinal: positive for constipation Hematologic/lymphatic: negative Musculoskeletal:negative Neurological: negative Behavioral/Psych: negative  Objective:    LMP 03/23/2017 (Approximate)   General:  alert and no distress   Breasts:  inspection negative, no nipple discharge or bleeding, no masses or nodularity palpable  Lungs: clear to auscultation bilaterally  Heart:  regular rate and rhythm, S1, S2 normal, no murmur, click, rub or gallop  Abdomen: soft, non-tender; bowel sounds normal; no masses,  no organomegaly.   Well healed Pfannenstiel incision   Vulva:  normal  Vagina: normal vagina, no discharge, exudate, lesion, or erythema  Cervix:  no cervical motion tenderness and no lesions  Corpus: normal size, contour, position, consistency, mobility, non-tender  Adnexa:  normal adnexa and no mass, fullness, tenderness  Rectal Exam: Not performed.           Assessment:  Post Partum Care visit 1. Postpartum care following vaginal delivery    2. S/P abdominal supracervical subtotal hysterectomy     Plan:  See orders and Patient Instructions Follow up in: 4 weeks or as needed.   Patient has filled rx for 60 tablets of percocet.  Discussed taking magnesium citrate or miralax to have a BM. Asked her to call if she is not able to have one by tomorrow. Encouraged hydration, asked her to hold iron tablets until she is able to poop regularly.  Incision clean dry intact.   Adelene Idler MD [facility],  [facility] 01/25/2018 1:35 PM

## 2018-01-26 DIAGNOSIS — I82890 Acute embolism and thrombosis of other specified veins: Secondary | ICD-10-CM

## 2018-01-26 HISTORY — DX: Acute embolism and thrombosis of other specified veins: I82.890

## 2018-01-27 ENCOUNTER — Other Ambulatory Visit: Payer: Self-pay

## 2018-01-27 ENCOUNTER — Emergency Department
Admission: EM | Admit: 2018-01-27 | Discharge: 2018-01-28 | Disposition: A | Discharge status: Home / Self Care | Payer: Medicaid Other | Attending: Emergency Medicine | Admitting: Emergency Medicine

## 2018-01-27 DIAGNOSIS — Z79899 Other long term (current) drug therapy: Secondary | ICD-10-CM | POA: Diagnosis not present

## 2018-01-27 DIAGNOSIS — Z87891 Personal history of nicotine dependence: Secondary | ICD-10-CM | POA: Insufficient documentation

## 2018-01-27 DIAGNOSIS — I82890 Acute embolism and thrombosis of other specified veins: Secondary | ICD-10-CM | POA: Insufficient documentation

## 2018-01-27 DIAGNOSIS — O9943 Diseases of the circulatory system complicating the puerperium: Secondary | ICD-10-CM | POA: Diagnosis not present

## 2018-01-27 DIAGNOSIS — R1012 Left upper quadrant pain: Secondary | ICD-10-CM | POA: Diagnosis not present

## 2018-01-27 DIAGNOSIS — R109 Unspecified abdominal pain: Secondary | ICD-10-CM | POA: Diagnosis present

## 2018-01-27 LAB — CBC WITH DIFFERENTIAL/PLATELET
Abs Immature Granulocytes: 0.03 10*3/uL (ref 0.00–0.07)
Basophils Absolute: 0.1 10*3/uL (ref 0.0–0.1)
Basophils Relative: 1 %
Eosinophils Absolute: 0.1 10*3/uL (ref 0.0–0.5)
Eosinophils Relative: 1 %
HCT: 32.2 % — ABNORMAL LOW (ref 36.0–46.0)
Hemoglobin: 9.9 g/dL — ABNORMAL LOW (ref 12.0–15.0)
Immature Granulocytes: 0 %
Lymphocytes Relative: 23 %
Lymphs Abs: 1.9 10*3/uL (ref 0.7–4.0)
MCH: 26.3 pg (ref 26.0–34.0)
MCHC: 30.7 g/dL (ref 30.0–36.0)
MCV: 85.6 fL (ref 80.0–100.0)
Monocytes Absolute: 0.6 10*3/uL (ref 0.1–1.0)
Monocytes Relative: 7 %
Neutro Abs: 5.4 10*3/uL (ref 1.7–7.7)
Neutrophils Relative %: 68 %
Platelets: 420 10*3/uL — ABNORMAL HIGH (ref 150–400)
RBC: 3.76 MIL/uL — ABNORMAL LOW (ref 3.87–5.11)
RDW: 15.7 % — ABNORMAL HIGH (ref 11.5–15.5)
WBC: 8.1 10*3/uL (ref 4.0–10.5)
nRBC: 0 % (ref 0.0–0.2)

## 2018-01-27 MED ORDER — ONDANSETRON HCL 4 MG/2ML IJ SOLN
4.0000 mg | Freq: Once | INTRAMUSCULAR | Status: AC
  Administered 2018-01-27: 4 mg via INTRAVENOUS
  Filled 2018-01-27: qty 2

## 2018-01-27 MED ORDER — MORPHINE SULFATE (PF) 4 MG/ML IV SOLN
7.0000 mg | Freq: Once | INTRAVENOUS | Status: AC
  Administered 2018-01-27: 7 mg via INTRAVENOUS
  Filled 2018-01-27: qty 2

## 2018-01-27 NOTE — ED Provider Notes (Signed)
[facility] Emergency Department Provider Note  ____________________________________________   First MD Initiated Contact with Patient 01/27/18 2319     (approximate)  I have reviewed the triage vital signs and the nursing notes.   HISTORY  Chief Complaint Abdominal Pain and Flank Pain   HPI Desiree Mosley is a 33 y.o. female who comes to the emergency department via EMS with abdominal pain as well as nausea that have become slowly progressive over the past several days.  2 weeks ago she had a vaginal delivery  and initially did well following her delivery however she has had daily pain ever since.  Tonight the pain became acutely worse which prompted the visit.  Her pain is in her right abdomen and severe.  It seems to be somewhat on her right flank as well.  No dysuria frequency or hesitancy.  Nothing seems to make the pain better or worse.  She is not breast-feeding.  She has difficulty eating.   History reviewed. No pertinent past medical history.  Patient Active Problem List   Diagnosis Date Noted  . S/P abdominal supracervical subtotal hysterectomy 01/25/2018  . 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 Normal vaginal delivery of fourth pregnancy 01/16/2018  . Retained placenta 01/16/2018  . Postpartum care following vaginal delivery 01/16/2018  . Encounter for elective induction of labor 01/15/2018  . Indication for care in labor and delivery, antepartum 01/11/2018  . Supervision of other normal pregnancy, antepartum 06/04/2017    Past Surgical History:  Procedure Laterality Date  . DILATION AND CURETTAGE OF UTERUS N/A 01/16/2018   Procedure: ,D&C,abdominal hysterectomy;  Surgeon: Natale Milch, MD;  Location: [facility] ORS;  Service: Gynecology;  Laterality: N/A;  . miscarrage      Prior to Admission medications   Medication Sig Start Date End Date Taking? Authorizing Provider  enoxaparin (LOVENOX) 120 MG/0.8ML injection Inject 0.47 mLs (70 mg  total) into the skin 2 (two) times daily. 01/28/18 01/28/19  Merrily Brittle, MD  ferrous sulfate 325 (65 FE) MG tablet Take 1 tablet (325 mg total) by mouth 2 (two) times daily with a meal. 01/18/18   Oswaldo Conroy, CNM  HYDROcodone-acetaminophen (NORCO) 5-325 MG tablet Take 1 tablet by mouth every 6 (six) hours as needed for up to 7 doses for severe pain. 01/28/18   Merrily Brittle, MD  ibuprofen (ADVIL,MOTRIN) 600 MG tablet Take 1 tablet (600 mg total) by mouth every 8 (eight) hours as needed. 01/28/18   Merrily Brittle, MD  metoCLOPramide (REGLAN) 10 MG tablet Take 1 tablet (10 mg total) by mouth 3 (three) times daily before meals. Patient not taking: Reported on 01/25/2018 01/19/18   Natale Milch, MD  oxyCODONE-acetaminophen (PERCOCET/ROXICET) 5-325 MG tablet Take 1-2 tablets by mouth every 6 (six) hours as needed. Patient not taking: Reported on 01/25/2018 01/21/18   Oswaldo Conroy, CNM  simethicone (GAS-X) 80 MG chewable tablet Chew 1 tablet (80 mg total) by mouth every 6 (six) hours as needed (gas). 01/19/18   Natale Milch, MD    Allergies Patient has no known allergies.  Family History  Problem Relation Age of Onset  . Cancer Mother   . Thyroid disease Mother   . Cancer Father   . Cancer Maternal Grandmother     Social History Social History   Tobacco Use  . Smoking status: Former Smoker    Types: Cigarettes  . Smokeless tobacco: Never Used  Substance Use Topics  . Alcohol use: No  . Drug  use: No    Review of Systems Constitutional: No fever/chills Eyes: No visual changes. ENT: No sore throat. Cardiovascular: Denies chest pain. Respiratory: Denies shortness of breath. Gastrointestinal: Positive for abdominal pain.  Positive for nausea, no vomiting.  No diarrhea.  No constipation. Genitourinary: Negative for dysuria. Musculoskeletal: Negative for back pain. Skin: Negative for rash. Neurological: Negative for headaches, focal weakness or  numbness.   ____________________________________________   PHYSICAL EXAM:  VITAL SIGNS: ED Triage Vitals  Enc Vitals Group     BP      Pulse      Resp      Temp      Temp src      SpO2      Weight      Height      Head Circumference      Peak Flow      Pain Score      Pain Loc      Pain Edu?      Excl. in GC?     Constitutional: Alert and oriented x4 appears quite uncomfortable though nontoxic no diaphoresis and speaks in full clear sentences Eyes: PERRL EOMI. Head: Atraumatic. Nose: No congestion/rhinnorhea. Mouth/Throat: No trismus Neck: No stridor.   Cardiovascular: Normal rate, regular rhythm. Grossly normal heart sounds.  Good peripheral circulation. Respiratory: Normal respiratory effort.  No retractions. Lungs CTAB and moving good air Gastrointestinal: Soft mild diffuse tenderness with no rebound or guarding no peritonitis Musculoskeletal: No lower extremity edema   Neurologic:  Normal speech and language. No gross focal neurologic deficits are appreciated. Skin:  Skin is warm, dry and intact. No rash noted. Psychiatric: Mood and affect are normal. Speech and behavior are normal.    ____________________________________________   DIFFERENTIAL includes but not limited to  Appendicitis, ovarian torsion, retained products, pyelonephritis ____________________________________________   LABS (all labs ordered are listed, but only abnormal results are displayed)  Labs Reviewed  COMPREHENSIVE METABOLIC PANEL - Abnormal; Notable for the following components:      Result Value   Glucose, Bld 101 (*)    Calcium 8.8 (*)    Albumin 3.0 (*)    All other components within normal limits  LIPASE, BLOOD - Abnormal; Notable for the following components:   Lipase 91 (*)    All other components within normal limits  CBC WITH DIFFERENTIAL/PLATELET - Abnormal; Notable for the following components:   RBC 3.76 (*)    Hemoglobin 9.9 (*)    HCT 32.2 (*)    RDW 15.7 (*)     Platelets 420 (*)    All other components within normal limits  URINALYSIS, COMPLETE (UACMP) WITH MICROSCOPIC - Abnormal; Notable for the following components:   Color, Urine YELLOW (*)    APPearance CLEAR (*)    Protein, ur 30 (*)    All other components within normal limits    Lab work reviewed by me with elevated lipase consistent with vomiting.  Otherwise her anemia is chronic. __________________________________________  EKG   ____________________________________________  RADIOLOGY  CT abdomen pelvis reviewed by me consistent with acute ovarian vein thrombosis on the right ____________________________________________   PROCEDURES  Procedure(s) performed: no  Procedures  Critical Care performed: no  ____________________________________________   INITIAL IMPRESSION / ASSESSMENT AND PLAN / ED COURSE  Pertinent labs & imaging results that were available during my care of the patient were reviewed by me and considered in my medical decision making (see chart for details).   As part of my medical decision  making, I reviewed the following data within the electronic MEDICAL RECORD NUMBER History obtained from family if available, nursing notes, old chart and ekg, as well as notes from prior ED visits.  The patient comes to the emergency department exquisitely uncomfortable appearing.  We will give 7 mg of IV morphine and 4 mg of IV Zofran for pain and nausea along with IV Tylenol for pain and sent her to CT scan once her labs are back.     ----------------------------------------- 12:52 AM on 01/28/2018 -----------------------------------------  I was called by radiology that the patient has ovarian vein thrombosis.  I will reach out to [facility] gynecology now. ____________________________________________ ----------------------------------------- 1:34 AM on 01/28/2018 -----------------------------------------  I spoke with Dr. Bonney Aid on call for [facility] who  recommended twice daily Lovenox instead of a novel oral anticoagulant.  I do lengthy discussion with the patient and we showed her how to perform the Lovenox injections and she thinks she is able to do it at home.  She is discharged home with 1 month of Lovenox along with hydrocodone and ibuprofen and OB gynecology follow-up.  Strict return precautions been given.  FINAL CLINICAL IMPRESSION(S) / ED DIAGNOSES  Final diagnoses:  Thrombosis of ovarian vein  Left upper quadrant pain      NEW MEDICATIONS STARTED DURING THIS VISIT:  Discharge Medication List as of 01/28/2018  1:35 AM    START taking these medications   Details  enoxaparin (LOVENOX) 120 MG/0.8ML injection Inject 0.47 mLs (70 mg total) into the skin 2 (two) times daily., Starting Thu 01/28/2018, Until Fri 01/28/2019, Print    HYDROcodone-acetaminophen (NORCO) 5-325 MG tablet Take 1 tablet by mouth every 6 (six) hours as needed for up to 7 doses for severe pain., Starting Thu 01/28/2018, Print    ibuprofen (ADVIL,MOTRIN) 600 MG tablet Take 1 tablet (600 mg total) by mouth every 8 (eight) hours as needed., Starting Thu 01/28/2018, Print         Note:  This document was prepared using Dragon voice recognition software and may include unintentional dictation errors.    Merrily Brittle, MD 01/31/18 [phone]

## 2018-01-27 NOTE — ED Triage Notes (Signed)
Pt arrived via [facility] from home with c/o abdominal pain and flank pain. EMS states pt recently had a natural birth on 1/11 and her placenta was surgically removed as well as her cervix. EMS states pt was sent home with pain medications but states the pain has recently increased in the last couple of days and has become really bearable.

## 2018-01-28 ENCOUNTER — Emergency Department: Payer: Medicaid Other

## 2018-01-28 ENCOUNTER — Encounter: Payer: Self-pay | Admitting: Radiology

## 2018-01-28 LAB — COMPREHENSIVE METABOLIC PANEL
ALT: 12 U/L (ref 0–44)
AST: 21 U/L (ref 15–41)
Albumin: 3 g/dL — ABNORMAL LOW (ref 3.5–5.0)
Alkaline Phosphatase: 104 U/L (ref 38–126)
Anion gap: 7 (ref 5–15)
BUN: 14 mg/dL (ref 6–20)
CO2: 27 mmol/L (ref 22–32)
Calcium: 8.8 mg/dL — ABNORMAL LOW (ref 8.9–10.3)
Chloride: 104 mmol/L (ref 98–111)
Creatinine, Ser: 0.81 mg/dL (ref 0.44–1.00)
GFR calc Af Amer: >60 mL/min (ref >60)
GFR calc non Af Amer: >60 mL/min (ref >60)
Glucose, Bld: 101 mg/dL — ABNORMAL HIGH (ref 70–99)
Potassium: 4 mmol/L (ref 3.5–5.1)
Sodium: 138 mmol/L (ref 135–145)
Total Bilirubin: 0.8 mg/dL (ref 0.3–1.2)
Total Protein: 7.3 g/dL (ref 6.5–8.1)

## 2018-01-28 LAB — URINALYSIS, COMPLETE (UACMP) WITH MICROSCOPIC
Bacteria, UA: NONE SEEN
Bilirubin Urine: NEGATIVE
Glucose, UA: NEGATIVE mg/dL
Hgb urine dipstick: NEGATIVE
Ketones, ur: NEGATIVE mg/dL
Leukocytes, UA: NEGATIVE
Nitrite: NEGATIVE
Protein, ur: 30 mg/dL — AB
Specific Gravity, Urine: 1.023 (ref 1.005–1.030)
pH: 6 (ref 5.0–8.0)

## 2018-01-28 LAB — LIPASE, BLOOD: Lipase: 91 U/L — ABNORMAL HIGH (ref 11–51)

## 2018-01-28 MED ORDER — IBUPROFEN 600 MG PO TABS: 600.0000 mg | ORAL_TABLET | Freq: Three times a day (TID) | ORAL | 0 refills | Status: DC | PRN

## 2018-01-28 MED ORDER — FAMOTIDINE IN NACL 20-0.9 MG/50ML-% IV SOLN: 20.0000 mg | Freq: Once | INTRAVENOUS | Status: DC

## 2018-01-28 MED ORDER — ALUM & MAG HYDROXIDE-SIMETH 200-200-20 MG/5ML PO SUSP
30.0000 mL | Freq: Once | ORAL | Status: AC
  Administered 2018-01-28: 30 mL via ORAL
  Filled 2018-01-28: qty 30

## 2018-01-28 MED ORDER — RIVAROXABAN (XARELTO) VTE STARTER PACK (15 & 20 MG): ORAL_TABLET | ORAL | 0 refills | Status: DC

## 2018-01-28 MED ORDER — ACETAMINOPHEN 10 MG/ML IV SOLN
1000.0000 mg | Freq: Once | INTRAVENOUS | Status: AC
  Administered 2018-01-28: 1000 mg via INTRAVENOUS
  Filled 2018-01-28: qty 100

## 2018-01-28 MED ORDER — HYDROCODONE-ACETAMINOPHEN 5-325 MG PO TABS: 1.0000 | ORAL_TABLET | Freq: Four times a day (QID) | ORAL | 0 refills | Status: DC | PRN

## 2018-01-28 MED ORDER — RIVAROXABAN 15 MG PO TABS: 15.0000 mg | ORAL_TABLET | Freq: Once | ORAL | Status: DC

## 2018-01-28 MED ORDER — SUCRALFATE 1 G PO TABS
1.0000 g | ORAL_TABLET | Freq: Once | ORAL | Status: AC
  Administered 2018-01-28: 1 g via ORAL
  Filled 2018-01-28: qty 1

## 2018-01-28 MED ORDER — ENOXAPARIN SODIUM 120 MG/0.8ML ~~LOC~~ SOLN: 1.0000 mg/kg | Freq: Two times a day (BID) | SUBCUTANEOUS | 0 refills | Status: DC

## 2018-01-28 MED ORDER — IOHEXOL 300 MG/ML  SOLN
100.0000 mL | Freq: Once | INTRAMUSCULAR | Status: AC | PRN
  Administered 2018-01-28: 100 mL via INTRAVENOUS

## 2018-01-28 MED ORDER — ENOXAPARIN SODIUM 80 MG/0.8ML ~~LOC~~ SOLN
1.0000 mg/kg | Freq: Once | SUBCUTANEOUS | Status: AC
  Administered 2018-01-28: 70 mg via SUBCUTANEOUS
  Filled 2018-01-28: qty 0.8

## 2018-01-28 MED ORDER — MISOPROSTOL 200 MCG PO TABS
200.0000 ug | ORAL_TABLET | Freq: Once | ORAL | Status: AC
  Administered 2018-01-28: 200 ug via ORAL
  Filled 2018-01-28: qty 1

## 2018-01-28 MED ORDER — KETOROLAC TROMETHAMINE 30 MG/ML IJ SOLN
15.0000 mg | Freq: Once | INTRAMUSCULAR | Status: AC
  Administered 2018-01-28: 15 mg via INTRAVENOUS
  Filled 2018-01-28: qty 1

## 2018-01-28 NOTE — Discharge Instructions (Signed)
Please use your Lovenox twice a day as prescribed and make an appointment to follow-up with your OB gynecologist this coming Wednesday for a recheck.  Return to the emergency department sooner for any concerns whatsoever such as fevers, chills, worsening pain, if you cannot eat or drink, or for any other issues whatsoever.  It was a pleasure to take care of you today, and thank you for coming to our emergency department.  If you have any questions or concerns before leaving please ask the nurse to grab me and I'm more than happy to go through your aftercare instructions again.  If you were prescribed any opioid pain medication today such as Norco, Vicodin, Percocet, morphine, hydrocodone, or oxycodone please make sure you do not drive when you are taking this medication as it can alter your ability to drive safely.  If you have any concerns once you are home that you are not improving or are in fact getting worse before you can make it to your follow-up appointment, please do not hesitate to call 911 and come back for further evaluation.  Merrily BrittleNeil Lyrick Lagrand, MD  Results for orders placed or performed during the hospital encounter of 01/27/18  Comprehensive metabolic panel  Result Value Ref Range   Sodium 138 135 - 145 mmol/L   Potassium 4.0 3.5 - 5.1 mmol/L   Chloride 104 98 - 111 mmol/L   CO2 27 22 - 32 mmol/L   Glucose, Bld 101 (H) 70 - 99 mg/dL   BUN 14 6 - 20 mg/dL   Creatinine, Ser 1.610.81 0.44 - 1.00 mg/dL   Calcium 8.8 (L) 8.9 - 10.3 mg/dL   Total Protein 7.3 6.5 - 8.1 g/dL   Albumin 3.0 (L) 3.5 - 5.0 g/dL   AST 21 15 - 41 U/L   ALT 12 0 - 44 U/L   Alkaline Phosphatase 104 38 - 126 U/L   Total Bilirubin 0.8 0.3 - 1.2 mg/dL   GFR calc non Af Amer >60 >60 mL/min   GFR calc Af Amer >60 >60 mL/min   Anion gap 7 5 - 15  Lipase, blood  Result Value Ref Range   Lipase 91 (H) 11 - 51 U/L  CBC with Differential  Result Value Ref Range   WBC 8.1 4.0 - 10.5 K/uL   RBC 3.76 (L) 3.87 - 5.11  MIL/uL   Hemoglobin 9.9 (L) 12.0 - 15.0 g/dL   HCT 09.632.2 (L) 04.536.0 - 40.946.0 %   MCV 85.6 80.0 - 100.0 fL   MCH 26.3 26.0 - 34.0 pg   MCHC 30.7 30.0 - 36.0 g/dL   RDW 81.115.7 (H) 91.411.5 - 78.215.5 %   Platelets 420 (H) 150 - 400 K/uL   nRBC 0.0 0.0 - 0.2 %   Neutrophils Relative % 68 %   Neutro Abs 5.4 1.7 - 7.7 K/uL   Lymphocytes Relative 23 %   Lymphs Abs 1.9 0.7 - 4.0 K/uL   Monocytes Relative 7 %   Monocytes Absolute 0.6 0.1 - 1.0 K/uL   Eosinophils Relative 1 %   Eosinophils Absolute 0.1 0.0 - 0.5 K/uL   Basophils Relative 1 %   Basophils Absolute 0.1 0.0 - 0.1 K/uL   Immature Granulocytes 0 %   Abs Immature Granulocytes 0.03 0.00 - 0.07 K/uL  Urinalysis, Complete w Microscopic  Result Value Ref Range   Color, Urine YELLOW (A) YELLOW   APPearance CLEAR (A) CLEAR   Specific Gravity, Urine 1.023 1.005 - 1.030   pH 6.0  5.0 - 8.0   Glucose, UA NEGATIVE NEGATIVE mg/dL   Hgb urine dipstick NEGATIVE NEGATIVE   Bilirubin Urine NEGATIVE NEGATIVE   Ketones, ur NEGATIVE NEGATIVE mg/dL   Protein, ur 30 (A) NEGATIVE mg/dL   Nitrite NEGATIVE NEGATIVE   Leukocytes, UA NEGATIVE NEGATIVE   RBC / HPF 0-5 0 - 5 RBC/hpf   WBC, UA 0-5 0 - 5 WBC/hpf   Bacteria, UA NONE SEEN NONE SEEN   Squamous Epithelial / LPF 0-5 0 - 5   Mucus PRESENT    Ct Abdomen Pelvis W Contrast  Result Date: 01/28/2018 CLINICAL DATA:  Abdominal and flank pain. History of vaginal delivery January 16, 2018. EXAM: CT ABDOMEN AND PELVIS WITH CONTRAST TECHNIQUE: Multidetector CT imaging of the abdomen and pelvis was performed using the standard protocol following bolus administration of intravenous contrast. CONTRAST:  100mL OMNIPAQUE IOHEXOL 300 MG/ML  SOLN COMPARISON:  CT abdomen and pelvis February 14, 2015 FINDINGS: LOWER CHEST: Lung bases are clear. Included heart size is normal. No pericardial effusion. HEPATOBILIARY: Liver and gallbladder are normal. PANCREAS: Normal. SPLEEN: Normal. ADRENALS/URINARY TRACT: Kidneys are orthotopic,  demonstrating symmetric enhancement. 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 Stable RIGHT upper pole calyx fills with contracts on delayed phase, similar findings LEFT upper pole. No nephrolithiasis, hydronephrosis or solid renal masses. The unopacified ureters are normal in course and caliber. Delayed imaging through the kidneys demonstrates symmetric prompt contrast excretion within the proximal urinary collecting system. Urinary bladder is partially distended and unremarkable. Normal adrenal glands. STOMACH/BOWEL: The stomach, small and large bowel are normal in course and caliber without inflammatory changes. Small volume retained large bowel stool. Normal appendix. VASCULAR/LYMPHATIC: Aortoiliac vessels are normal in course and caliber. No lymphadenopathy by CT size criteria. RIGHT ovarian vein X acute thrombosis as evidence by central filling defect (series 2, image 50 and series 5, image 44). REPRODUCTIVE: Ill-defined uterus, possible partial hysterectomy. OTHER: Small volume free fluid may be physiologic. No intraperitoneal free air. MUSCULOSKELETAL: Nonacute.  Anterior pelvic wall edema versus scar. IMPRESSION: 1. Acute RIGHT ovarian vein thrombosis. 2. No acute renal pathology.  Atrophic and scarred LEFT kidney. 3. Acute findings discussed with and reconfirmed by Dr.Idris Edmundson on 01/28/2018 at 12:54 am. Electronically Signed   By: Awilda Metroourtnay  Bloomer M.D.   On: 01/28/2018 00:54

## 2018-01-28 NOTE — ED Notes (Signed)
Pt ride here. Pt verbalized discharge instructions and okayed to be discharged.

## 2018-02-04 ENCOUNTER — Encounter: Payer: Self-pay | Admitting: Obstetrics and Gynecology

## 2018-02-04 ENCOUNTER — Ambulatory Visit (INDEPENDENT_AMBULATORY_CARE_PROVIDER_SITE_OTHER): Payer: Medicaid Other | Admitting: Obstetrics and Gynecology

## 2018-02-04 VITALS — BP 108/60 | HR 88 | Ht 66.0 in | Wt 161.0 lb

## 2018-02-04 DIAGNOSIS — Z90711 Acquired absence of uterus with remaining cervical stump: Secondary | ICD-10-CM

## 2018-02-04 DIAGNOSIS — N261 Atrophy of kidney (terminal): Secondary | ICD-10-CM

## 2018-02-04 NOTE — Progress Notes (Signed)
Patient ID: Desiree Mosley, female   DOB: 1985-06-15, 33 y.o.   MRN: [id number]  Reason for Consult: Follow-up (5/10 pain scale)   Referred by No ref. provider found  Subjective:     HPI:  Desiree Mosley is a 33 y.o. female . She was diagnosed last week in the ER with a right ovarian vein thrombosis. She has been taking Lovenox twice a day. She reports that her pain though has been on the left side. She has pain along her pelvic crest and into her flank. It was noted on her recent CT that her left kidney is atrophic. This was also the case in 2017. She denies having any history of kidney disease. The cause of these findings has not been determined. Her Creatinine labs have been within normal limits. She does not have hydronephrosis.   She reports bowel function has improved and she is having a bowel movement every other day. She denies vaginal bleeding. She has been sexually active twice. She declines pap smear today and would like to wait until her 6 wk Postpartum visit.   Past Medical History:  Diagnosis Date  . Thrombosis of ovarian vein 01/26/2018   In right ovary   Family History  Problem Relation Age of Onset  . Cancer Mother   . Thyroid disease Mother   . Cancer Father   . Cancer Maternal Grandmother    Past Surgical History:  Procedure Laterality Date  . ABDOMINAL HYSTERECTOMY  01/16/2018   Placenta increta  . DILATION AND CURETTAGE OF UTERUS N/A 01/16/2018   Procedure: ,D&C,abdominal hysterectomy;  Surgeon: Natale MilchSchuman, Pardeep Pautz R, MD;  Location: [facility] ORS;  Service: Gynecology;  Laterality: N/A;  . miscarrage      Short Social History:  Social History   Tobacco Use  . Smoking status: Former Smoker    Types: Cigarettes  . Smokeless tobacco: Never Used  Substance Use Topics  . Alcohol use: No    No Known Allergies  Current Outpatient Medications  Medication Sig Dispense Refill  . enoxaparin (LOVENOX) 80 MG/0.8ML injection INJECT 70MG  2X A DAY    .  ibuprofen (ADVIL,MOTRIN) 600 MG tablet Take 1 tablet (600 mg total) by mouth every 8 (eight) hours as needed. 30 tablet 0  . metoCLOPramide (REGLAN) 10 MG tablet Take 1 tablet (10 mg total) by mouth 3 (three) times daily before meals. 30 tablet 1  . HYDROcodone-acetaminophen (NORCO) 5-325 MG tablet Take 1 tablet by mouth every 6 (six) hours as needed for up to 7 doses for severe pain. (Patient not taking: Reported on 02/04/2018) 7 tablet 0  . oxyCODONE-acetaminophen (PERCOCET/ROXICET) 5-325 MG tablet Take 1-2 tablets by mouth every 6 (six) hours as needed. (Patient not taking: Reported on 01/25/2018) 30 tablet 0   No current facility-administered medications for this visit.     Review of Systems  Constitutional: Negative for chills, fatigue, fever and unexpected weight change.  HENT: Negative for trouble swallowing.  Eyes: Negative for loss of vision.  Respiratory: Negative for cough, shortness of breath and wheezing.  Cardiovascular: Negative for chest pain, leg swelling, palpitations and syncope.  GI: Negative for abdominal pain, blood in stool, diarrhea, nausea and vomiting.  GU: Negative for difficulty urinating, dysuria, frequency and hematuria.  Musculoskeletal: Negative for back pain, leg pain and joint pain.  Skin: Negative for rash.  Neurological: Negative for dizziness, headaches, light-headedness, numbness and seizures.  Psychiatric: Negative for behavioral problem, confusion, depressed mood and sleep disturbance.  Objective:  Objective   Vitals:   02/04/18 1045  BP: 108/60  Pulse: 88  Weight: 161 lb (73 kg)  Height: 5\' 6"  (1.676 m)   Body mass index is 25.99 kg/m.  Physical Exam Vitals signs and nursing note reviewed.  Constitutional:      Appearance: She is well-developed.  HENT:     Head: Normocephalic and atraumatic.  Eyes:     Pupils: Pupils are equal, round, and reactive to light.  Cardiovascular:     Rate and Rhythm: Normal rate and regular rhythm.    Pulmonary:     Effort: Pulmonary effort is normal. No respiratory distress.  Skin:    General: Skin is warm and dry.  Neurological:     Mental Status: She is alert and oriented to person, place, and time.  Psychiatric:        Behavior: Behavior normal.        Thought Content: Thought content normal.        Judgment: Judgment normal.         Assessment/Plan:    33 yo s/p SVD and postpartum hysterectomy for increta. Postoperative stat has been complicate by right ovarian vein thrombosis. She is on 70mg  of lovenox twice a day. She will continue anticoagulation for 6 weeks. Will refer to nephrology for atrophic kidney for further consultation as to possible etiology, monitoring and preservation of right kidney.    Adelene Idler MD [facility], [facility] 02/04/2018 11:47 AM

## 2018-02-05 LAB — CREATININE, SERUM
Creatinine, Ser: 0.76 mg/dL (ref 0.57–1.00)
GFR calc Af Amer: 121 mL/min/{1.73_m2} (ref >59)
GFR calc non Af Amer: 105 mL/min/{1.73_m2} (ref >59)

## 2018-02-05 NOTE — Progress Notes (Signed)
Negative, Released to mychart 

## 2018-02-10 ENCOUNTER — Encounter: Payer: Self-pay | Admitting: Emergency Medicine

## 2018-02-10 ENCOUNTER — Emergency Department
Admission: EM | Admit: 2018-02-10 | Discharge: 2018-02-10 | Disposition: A | Discharge status: Home / Self Care | Payer: Medicaid Other | Attending: Emergency Medicine | Admitting: Emergency Medicine

## 2018-02-10 ENCOUNTER — Other Ambulatory Visit: Payer: Self-pay

## 2018-02-10 DIAGNOSIS — R8290 Unspecified abnormal findings in urine: Secondary | ICD-10-CM | POA: Insufficient documentation

## 2018-02-10 DIAGNOSIS — F17210 Nicotine dependence, cigarettes, uncomplicated: Secondary | ICD-10-CM | POA: Diagnosis not present

## 2018-02-10 DIAGNOSIS — R1032 Left lower quadrant pain: Secondary | ICD-10-CM | POA: Diagnosis present

## 2018-02-10 DIAGNOSIS — Z79899 Other long term (current) drug therapy: Secondary | ICD-10-CM | POA: Diagnosis not present

## 2018-02-10 DIAGNOSIS — O9989 Other specified diseases and conditions complicating pregnancy, childbirth and the puerperium: Secondary | ICD-10-CM | POA: Insufficient documentation

## 2018-02-10 LAB — URINALYSIS, COMPLETE (UACMP) WITH MICROSCOPIC
Bacteria, UA: NONE SEEN
Bilirubin Urine: NEGATIVE
Glucose, UA: NEGATIVE mg/dL
Hgb urine dipstick: NEGATIVE
Ketones, ur: NEGATIVE mg/dL
Nitrite: NEGATIVE
Protein, ur: NEGATIVE mg/dL
Specific Gravity, Urine: 1.025 (ref 1.005–1.030)
pH: 5 (ref 5.0–8.0)

## 2018-02-10 LAB — COMPREHENSIVE METABOLIC PANEL
ALT: 17 U/L (ref 0–44)
AST: 19 U/L (ref 15–41)
Albumin: 4.2 g/dL (ref 3.5–5.0)
Alkaline Phosphatase: 95 U/L (ref 38–126)
Anion gap: 5 (ref 5–15)
BUN: 16 mg/dL (ref 6–20)
CO2: 30 mmol/L (ref 22–32)
Calcium: 8.9 mg/dL (ref 8.9–10.3)
Chloride: 101 mmol/L (ref 98–111)
Creatinine, Ser: 0.65 mg/dL (ref 0.44–1.00)
GFR calc Af Amer: >60 mL/min (ref >60)
GFR calc non Af Amer: >60 mL/min (ref >60)
Glucose, Bld: 88 mg/dL (ref 70–99)
Potassium: 3.9 mmol/L (ref 3.5–5.1)
Sodium: 136 mmol/L (ref 135–145)
Total Bilirubin: 0.5 mg/dL (ref 0.3–1.2)
Total Protein: 7.9 g/dL (ref 6.5–8.1)

## 2018-02-10 LAB — POCT PREGNANCY, URINE: Preg Test, Ur: NEGATIVE

## 2018-02-10 LAB — CBC
HCT: 38.7 % (ref 36.0–46.0)
Hemoglobin: 11.7 g/dL — ABNORMAL LOW (ref 12.0–15.0)
MCH: 26.4 pg (ref 26.0–34.0)
MCHC: 30.2 g/dL (ref 30.0–36.0)
MCV: 87.2 fL (ref 80.0–100.0)
Platelets: 396 10*3/uL (ref 150–400)
RBC: 4.44 MIL/uL (ref 3.87–5.11)
RDW: 15.9 % — ABNORMAL HIGH (ref 11.5–15.5)
WBC: 8.6 10*3/uL (ref 4.0–10.5)
nRBC: 0 % (ref 0.0–0.2)

## 2018-02-10 LAB — LIPASE, BLOOD: Lipase: 35 U/L (ref 11–51)

## 2018-02-10 MED ORDER — PHENAZOPYRIDINE HCL 200 MG PO TABS
200.0000 mg | ORAL_TABLET | Freq: Once | ORAL | Status: AC
  Administered 2018-02-10: 200 mg via ORAL
  Filled 2018-02-10: qty 1

## 2018-02-10 MED ORDER — DICYCLOMINE HCL 10 MG PO CAPS
20.0000 mg | ORAL_CAPSULE | Freq: Once | ORAL | Status: AC
  Administered 2018-02-10: 20 mg via ORAL
  Filled 2018-02-10: qty 2

## 2018-02-10 NOTE — Discharge Instructions (Addendum)
Please seek medical attention for any high fevers, chest pain, shortness of breath, change in behavior, persistent vomiting, bloody stool or any other new or concerning symptoms.  

## 2018-02-10 NOTE — ED Provider Notes (Signed)
[facility] Emergency Department Provider Note  ____________________________________________   I have reviewed the triage vital signs and the nursing notes.   HISTORY  Chief Complaint Abdominal Pain   History limited by: Not Limited   HPI Desiree Mosley is a 33 y.o. female who presents to the emergency department today because of concerns for continued abdominal pain.  She states it is located in her left abdomen and left flank.  She describes it as a squeezing type pain.  The patient has had this pain since she was seen in the emergency department roughly 2 weeks ago.  She was diagnosed with a right ovarian vein thrombosis.  She states she has been taking her Lovenox as prescribed.  She did follow-up with OB/GYN for this who referred her to urology however patient has not yet followed up with urology.  She denies any fevers.  Denies any change in urination or defecation.    Per medical record review patient has a history of right ovarian vein thrombosis.   Past Medical History:  Diagnosis Date  . 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 S/P abdominal supracervical subtotal hysterectomy 01/25/2018  . 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 Normal vaginal delivery of fourth pregnancy 01/16/2018  . Retained placenta 01/16/2018  . Postpartum care following vaginal delivery 01/16/2018  . Encounter for elective induction of labor 01/15/2018  . Indication for care in labor and delivery, antepartum 01/11/2018  . Supervision of other normal pregnancy, antepartum 06/04/2017    Past Surgical History:  Procedure Laterality Date  . ABDOMINAL HYSTERECTOMY  01/16/2018   Placenta increta  . DILATION AND CURETTAGE OF UTERUS N/A 01/16/2018   Procedure: ,D&C,abdominal hysterectomy;  Surgeon: Natale Milch, MD;  Location: [facility] ORS;  Service: Gynecology;  Laterality: N/A;  . miscarrage       Prior to Admission medications   Medication Sig Start Date End Date Taking? Authorizing Provider  enoxaparin (LOVENOX) 80 MG/0.8ML injection INJECT 70MG  2X A DAY 01/28/18   [provider]  HYDROcodone-acetaminophen (NORCO) 5-325 MG tablet Take 1 tablet by mouth every 6 (six) hours as needed for up to 7 doses for severe pain. Patient not taking: Reported on 02/04/2018 01/28/18   Merrily Brittle, MD  ibuprofen (ADVIL,MOTRIN) 600 MG tablet Take 1 tablet (600 mg total) by mouth every 8 (eight) hours as needed. 01/28/18   Merrily Brittle, MD  metoCLOPramide (REGLAN) 10 MG tablet Take 1 tablet (10 mg total) by mouth 3 (three) times daily before meals. 01/19/18   Schuman, Jaquelyn Bitter, MD  oxyCODONE-acetaminophen (PERCOCET/ROXICET) 5-325 MG tablet Take 1-2 tablets by mouth every 6 (six) hours as needed. Patient not taking: Reported on 01/25/2018 01/21/18   Oswaldo Conroy, CNM    Allergies Patient has no known allergies.  Family History  Problem Relation Age of Onset  . Cancer Mother   . Thyroid disease Mother   . Cancer Father   . Cancer Maternal Grandmother     Social History Social History   Tobacco Use  . Smoking status: Current Every Day Smoker    Types: Cigarettes  . Smokeless tobacco: Never Used  Substance Use Topics  . Alcohol use: No  . Drug use: No    Review of Systems Constitutional: No fever/chills Eyes: No visual changes. ENT: No sore throat. Cardiovascular: Denies chest pain. Respiratory: Denies shortness of breath. Gastrointestinal: Positive for left abdomina and left  flank pain.   Genitourinary: Negative for dysuria. Musculoskeletal: Negative for back pain. Skin: Negative for rash. Neurological: Negative for headaches, focal weakness or numbness.  ____________________________________________   PHYSICAL EXAM:  VITAL SIGNS: ED Triage Vitals  Enc Vitals Group     BP 02/10/18 1915 125/75     Pulse Rate 02/10/18 1915 84     Resp 02/10/18 1915 18      Temp 02/10/18 1915 98.2 F (36.8 C)     Temp Source 02/10/18 1915 Oral     SpO2 02/10/18 1915 99 %     Weight 02/10/18 1915 155 lb (70.3 kg)     Height 02/10/18 1915 5\' 6"  (1.676 m)     Head Circumference --      Peak Flow --      Pain Score 02/10/18 1923 6    Constitutional: Alert and oriented.  Eyes: Conjunctivae are normal.  ENT      Head: Normocephalic and atraumatic.      Nose: No congestion/rhinnorhea.      Mouth/Throat: Mucous membranes are moist.      Neck: No stridor. Hematological/Lymphatic/Immunilogical: No cervical lymphadenopathy. Cardiovascular: Normal rate, regular rhythm.  No murmurs, rubs, or gallops.  Respiratory: Normal respiratory effort without tachypnea nor retractions. Breath sounds are clear and equal bilaterally. No wheezes/rales/rhonchi. Gastrointestinal: Soft and tender to palpation in the left lower quadrant.  Genitourinary: Deferred Musculoskeletal: Normal range of motion in all extremities. No lower extremity edema. Neurologic:  Normal speech and language. No gross focal neurologic deficits are appreciated.  Skin:  Skin is warm, dry and intact. No rash noted. Psychiatric: Mood and affect are normal. Speech and behavior are normal. Patient exhibits appropriate insight and judgment.  ____________________________________________    LABS (pertinent positives/negatives)  Upreg negative Lipase 35 CBC wbc 8.6, hgb 11.7, plt 396 CMP wnl UA clear, moderate leukocytes, 6-10 WBC  ____________________________________________   EKG  None  ____________________________________________    RADIOLOGY  None  ____________________________________________   PROCEDURES  Procedures  ____________________________________________   INITIAL IMPRESSION / ASSESSMENT AND PLAN / ED COURSE  Pertinent labs & imaging results that were available during my care of the patient were reviewed by me and considered in my medical decision making (see chart for  details).   Patient presented to the emergency department today because of concerns for left sided abdominal pain.  It has been constant since she was seen in emergency department roughly 1 month ago.  She states it is not getting worse but it is also not getting better.  No leukocytosis today.  Given that there is no leukocytosis and the pain is not getting worse I do not feel there would be significant value in repeating imaging.  I discussed this with the patient.  Discussed with the patient that she would benefit from continued follow-up with both OB/GYN and urology.  Urine did have some white blood cells.  Did send for culture. Will defer antibiotics given lack of urinary symptoms.  ____________________________________________   FINAL CLINICAL IMPRESSION(S) / ED DIAGNOSES  Final diagnoses:  Left lower quadrant abdominal pain     Note: This dictation was prepared with Dragon dictation. Any transcriptional errors that result from this process are unintentional     Phineas Semen, MD 02/10/18 [id number]

## 2018-02-10 NOTE — ED Triage Notes (Addendum)
Pt presents to ED with left sided lower abd pain that radiates around to her back. Pt reports she was seen in this ED last month for the same. Had vaginal delivery on 1/11. CT scan performed at that time and pt was told she had a blood clot in the right side of her abd (unsure of exact location). Pt was placed on antibiotics and blood thinners. Pt states her pain has not improved at all. Pain seems to improve with touch.

## 2018-02-12 LAB — URINE CULTURE

## 2018-02-23 ENCOUNTER — Other Ambulatory Visit: Payer: Self-pay

## 2018-02-23 ENCOUNTER — Encounter: Payer: Self-pay | Admitting: Emergency Medicine

## 2018-02-23 ENCOUNTER — Emergency Department
Admission: EM | Admit: 2018-02-23 | Discharge: 2018-02-23 | Disposition: A | Discharge status: Home / Self Care | Payer: Medicaid Other | Attending: Emergency Medicine | Admitting: Emergency Medicine

## 2018-02-23 DIAGNOSIS — F17210 Nicotine dependence, cigarettes, uncomplicated: Secondary | ICD-10-CM | POA: Diagnosis not present

## 2018-02-23 DIAGNOSIS — K644 Residual hemorrhoidal skin tags: Secondary | ICD-10-CM | POA: Insufficient documentation

## 2018-02-23 DIAGNOSIS — Z79899 Other long term (current) drug therapy: Secondary | ICD-10-CM | POA: Diagnosis not present

## 2018-02-23 DIAGNOSIS — K6289 Other specified diseases of anus and rectum: Secondary | ICD-10-CM | POA: Diagnosis present

## 2018-02-23 MED ORDER — LIDOCAINE HCL URETHRAL/MUCOSAL 2 % EX GEL
1.0000 "application " | Freq: Once | CUTANEOUS | Status: AC
  Administered 2018-02-23: 1
  Filled 2018-02-23: qty 10

## 2018-02-23 MED ORDER — PRAMOXINE HCL 1 % RE FOAM: 1.0000 "application " | Freq: Three times a day (TID) | RECTAL | 0 refills | Status: DC | PRN

## 2018-02-23 MED ORDER — POLYETHYLENE GLYCOL 3350 17 GM/SCOOP PO POWD: ORAL | 2 refills | Status: DC

## 2018-02-23 MED ORDER — TUCKS 50 % EX PADS: 1.0000 "application " | MEDICATED_PAD | CUTANEOUS | 2 refills | Status: DC | PRN

## 2018-02-23 NOTE — ED Provider Notes (Signed)
[facility] Emergency Department Provider Note  ____________________________________________  Time seen: Approximately 8:24 AM  I have reviewed the triage vital signs and the nursing notes.   HISTORY  Chief Complaint Hemorrhoids    HPI Desiree Mosley is a 33 y.o. female with a history of hysterectomy and right ovarian vein thrombosis who just completed the course of Lovenox complains of anal pain that started after having a bowel movement which caused prolapse of a hemorrhoid.  She was able to reduce the hemorrhoid, but states that it still feels very painful.  No black or bloody stool.  Last bowel movement was yesterday.  She reports her bowel movements are about every 3 days and she is not taking any fiber supplements or stool softeners.  Pain is waxing and waning, worse sitting upright, better laying down on her side.  No chest pain shortness of breath fevers or chills.  No vomiting.  Denies abdominal pain.  No dysuria      Past Medical History:  Diagnosis Date  . Thrombosis of ovarian vein 01/26/2018   In right ovary     Patient Active Problem List   Diagnosis Date Noted  . S/P abdominal supracervical subtotal hysterectomy 01/25/2018  . 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 Normal vaginal delivery of fourth pregnancy 01/16/2018  . Retained placenta 01/16/2018  . Postpartum care following vaginal delivery 01/16/2018  . Encounter for elective induction of labor 01/15/2018  . Indication for care in labor and delivery, antepartum 01/11/2018  . Supervision of other normal pregnancy, antepartum 06/04/2017     Past Surgical History:  Procedure Laterality Date  . ABDOMINAL HYSTERECTOMY  01/16/2018   Placenta increta  . DILATION AND CURETTAGE OF UTERUS N/A 01/16/2018   Procedure: ,D&C,abdominal hysterectomy;  Surgeon: Natale Milch, MD;  Location: [facility] ORS;  Service: Gynecology;  Laterality: N/A;  . miscarrage       Prior to Admission  medications   Medication Sig Start Date End Date Taking? Authorizing Provider  enoxaparin (LOVENOX) 80 MG/0.8ML injection INJECT 70MG  2X A DAY 01/28/18   [provider]  HYDROcodone-acetaminophen (NORCO) 5-325 MG tablet Take 1 tablet by mouth every 6 (six) hours as needed for up to 7 doses for severe pain. Patient not taking: Reported on 02/04/2018 01/28/18   Merrily Brittle, MD  ibuprofen (ADVIL,MOTRIN) 600 MG tablet Take 1 tablet (600 mg total) by mouth every 8 (eight) hours as needed. 01/28/18   Merrily Brittle, MD  metoCLOPramide (REGLAN) 10 MG tablet Take 1 tablet (10 mg total) by mouth 3 (three) times daily before meals. 01/19/18   Schuman, Jaquelyn Bitter, MD  oxyCODONE-acetaminophen (PERCOCET/ROXICET) 5-325 MG tablet Take 1-2 tablets by mouth every 6 (six) hours as needed. Patient not taking: Reported on 01/25/2018 01/21/18   Oswaldo Conroy, CNM  polyethylene glycol powder [facility]) powder One-half cap full in a full glass of water, once daily 02/23/18   Sharman Cheek, MD  pramoxine (PROCTOFOAM) 1 % foam Place 1 application rectally 3 (three) times daily as needed for hemorrhoids. 02/23/18   Sharman Cheek, MD  Witch Hazel (TUCKS) 50 % PADS Apply 1 application topically every 2 (two) hours as needed. 02/23/18   Sharman Cheek, MD     Allergies Patient has no known allergies.   Family History  Problem Relation Age of Onset  . Cancer Mother   . Thyroid disease Mother   . Cancer Father   . Cancer Maternal Grandmother     Social History Social History  Tobacco Use  . Smoking status: Current Every Day Smoker    Types: Cigarettes  . Smokeless tobacco: Never Used  Substance Use Topics  . Alcohol use: No  . Drug use: No    Review of Systems  Constitutional:   No fever or chills.  ENT:   No sore throat. No rhinorrhea. Cardiovascular:   No chest pain or syncope. Respiratory:   No dyspnea or cough. Gastrointestinal:   Negative for abdominal pain, vomiting  and diarrhea.  Anal pain as above related to hemorrhoid prolapse Musculoskeletal:   Negative for focal pain or swelling All other systems reviewed and are negative except as documented above in ROS and HPI.  ____________________________________________   PHYSICAL EXAM:  VITAL SIGNS: ED Triage Vitals  Enc Vitals Group     BP 02/23/18 0352 (!) 184/101     Pulse Rate 02/23/18 0352 99     Resp 02/23/18 0352 20     Temp 02/23/18 0352 98 F (36.7 C)     Temp Source 02/23/18 0352 Oral     SpO2 02/23/18 0352 98 %     Weight 02/23/18 0353 151 lb (68.5 kg)     Height 02/23/18 0353 5\' 6"  (1.676 m)     Head Circumference --      Peak Flow --      Pain Score 02/23/18 0352 10     Pain Loc --      Pain Edu? --      Excl. in GC? --     Vital signs reviewed, nursing assessments reviewed.   Constitutional:   Alert and oriented. Non-toxic appearance. Eyes:   Conjunctivae are normal. EOMI. ENT      Head:   Normocephalic and atraumatic.      Neck:   No meningismus. Full ROM. Gastrointestinal:   Soft and nontender. Non distended. There is no CVA tenderness.  No rebound, rigidity, or guarding.  Pfannenstiel incision well-healed.  Rectal exam performed with nurse bill and female nurse-orientee at bedside.  There is a small external hemorrhoid that is not engorged inflamed thrombosed or bleeding.  Soft to the touch.  No perineal inflammation or induration.  Patient unable to tolerate digital rectal exam.  Musculoskeletal:   Normal range of motion in all extremities. Neurologic:   Normal speech and language.  Motor grossly intact. No acute focal neurologic deficits are appreciated.   ____________________________________________    LABS (pertinent positives/negatives) (all labs ordered are listed, but only abnormal results are displayed) Labs Reviewed - No data to display ____________________________________________   EKG    ____________________________________________     RADIOLOGY  No results found.  ____________________________________________   PROCEDURES Procedures  ____________________________________________    CLINICAL IMPRESSION / ASSESSMENT AND PLAN / ED COURSE  Pertinent labs & imaging results that were available during my care of the patient were reviewed by me and considered in my medical decision making (see chart for details).    Patient nontoxic, not in distress, well-appearing, complains of hemorrhoid pain.  No apparent complication from the hemorrhoid, no evidence of infection or inflammation of the perineum.  I doubt vascular occlusion or thrombosis or venous thromboembolism.  No symptoms to suggest GI bleed.  Vital signs are unremarkable except for elevated blood pressure which I attribute to her pain and stress level.  We will give her some rectal viscous lidocaine here for symptom control and discharge with a regimen to manage the symptoms.  Recommend sitz baths and follow-up with surgery clinic given the  pain.  Reviewed controlled substance reporting system which does show that after her last delivery she went through a relatively large amount of oxycodone in a short period of time, I am reluctant at this time to give her opioids for the pain, not wanting to further risk of the evolution of dependence.        ____________________________________________   FINAL CLINICAL IMPRESSION(S) / ED DIAGNOSES    Final diagnoses:  External hemorrhoid     ED Discharge Orders         Ordered    pramoxine (PROCTOFOAM) 1 % foam  3 times daily PRN     02/23/18 0823    Witch Hazel (TUCKS) 50 % PADS  Every 2 hours PRN     02/23/18 0823    polyethylene glycol powder (GLYCOLAX/MIRALAX) powder     02/23/18 0823          Portions of this note were generated with dragon dictation software. Dictation errors may occur despite best attempts at proofreading.   Sharman Cheek, MD 02/23/18 [phone]

## 2018-02-23 NOTE — ED Notes (Signed)
Pt stated that she has hemorrhoids and that she used some cream last night and since then it has been burning and she can not lay down because the pain is worse.

## 2018-02-23 NOTE — ED Triage Notes (Signed)
Pt to ED from home via EMS for complaints of hemorrhoid pain. Pt states that she tried several over the counter medications without relieve. Pt is AOX4 in mild pain during triage.

## 2018-03-03 ENCOUNTER — Ambulatory Visit: Payer: Medicaid Other | Admitting: Certified Nurse Midwife

## 2018-03-10 ENCOUNTER — Ambulatory Visit: Payer: Medicaid Other | Admitting: Certified Nurse Midwife

## 2018-03-15 ENCOUNTER — Ambulatory Visit: Payer: Medicaid Other | Admitting: Certified Nurse Midwife

## 2018-03-15 ENCOUNTER — Ambulatory Visit: Payer: Medicaid Other | Admitting: Surgery

## 2018-03-19 ENCOUNTER — Telehealth: Payer: Self-pay | Admitting: Certified Nurse Midwife

## 2018-03-19 ENCOUNTER — Encounter: Payer: Self-pay | Admitting: *Deleted

## 2018-03-19 NOTE — Telephone Encounter (Signed)
Patient needs to reschedule 6 wk pp check w/ Colleen. Lmtrc.

## 2018-03-24 NOTE — Telephone Encounter (Signed)
Lmtrc

## 2018-05-06 NOTE — Telephone Encounter (Signed)
Lmtrc

## 2018-06-21 ENCOUNTER — Encounter: Payer: Self-pay | Admitting: *Deleted

## 2018-10-17 ENCOUNTER — Emergency Department
Admission: EM | Admit: 2018-10-17 | Discharge: 2018-10-17 | Disposition: A | Discharge status: Home / Self Care | Payer: Medicaid Other | Attending: Emergency Medicine | Admitting: Emergency Medicine

## 2018-10-17 ENCOUNTER — Encounter: Payer: Self-pay | Admitting: Emergency Medicine

## 2018-10-17 ENCOUNTER — Other Ambulatory Visit: Payer: Self-pay

## 2018-10-17 DIAGNOSIS — Z79899 Other long term (current) drug therapy: Secondary | ICD-10-CM | POA: Insufficient documentation

## 2018-10-17 DIAGNOSIS — L089 Local infection of the skin and subcutaneous tissue, unspecified: Secondary | ICD-10-CM | POA: Diagnosis not present

## 2018-10-17 DIAGNOSIS — Z7901 Long term (current) use of anticoagulants: Secondary | ICD-10-CM | POA: Insufficient documentation

## 2018-10-17 DIAGNOSIS — F17210 Nicotine dependence, cigarettes, uncomplicated: Secondary | ICD-10-CM | POA: Diagnosis not present

## 2018-10-17 DIAGNOSIS — M79674 Pain in right toe(s): Secondary | ICD-10-CM | POA: Diagnosis present

## 2018-10-17 MED ORDER — CEPHALEXIN 500 MG PO CAPS: 500.0000 mg | ORAL_CAPSULE | Freq: Four times a day (QID) | ORAL | 0 refills | Status: DC

## 2018-10-17 NOTE — ED Triage Notes (Signed)
Patient presents to the ED with redness, pain and swelling to right great toe.  Patient reports pain for several months and swelling x 3 weeks.  Denies known injury.  Patient states pain is worse with extensive walking.  Area around nail bed appears inflamed.

## 2018-10-17 NOTE — ED Provider Notes (Signed)
[facility] Emergency Department Provider Note  ____________________________________________   None    (approximate)  I have reviewed the triage vital signs and the nursing notes.   HISTORY  Chief Complaint Toe Pain   HPI Desiree Mosley is a 33 y.o. female presents to the ED with complaint of redness and pain along with swelling to her right great toe.  Patient states that she has had this for approximately 3 weeks.  She has been using Epson salt to soak her foot.  Pain has gradually gotten worse.  She denies any fever, chills, nausea or vomiting.  She denies any injury to her toe.      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   Normal vaginal delivery of fourth pregnancy 01/16/2018   Retained placenta 01/16/2018   Postpartum care following vaginal delivery 01/16/2018   Encounter for elective induction of labor 01/15/2018   Indication for care in labor and delivery, antepartum 01/11/2018   Supervision of other normal pregnancy, antepartum 06/04/2017    Past Surgical History:  Procedure Laterality Date   ABDOMINAL HYSTERECTOMY  01/16/2018   Placenta increta   DILATION AND CURETTAGE OF UTERUS N/A 01/16/2018   Procedure: ,D&C,abdominal hysterectomy;  Surgeon: Homero Fellers, MD;  Location: [facility] ORS;  Service: Gynecology;  Laterality: N/A;   miscarrage      Prior to Admission medications   Medication Sig Start Date End Date Taking? Authorizing Provider  cephALEXin (KEFLEX) 500 MG capsule Take 1 capsule (500 mg total) by mouth 4 (four) times daily. 10/17/18   Johnn Hai, PA-C  enoxaparin (LOVENOX) 80 MG/0.8ML injection INJECT 70MG  2X A DAY 01/28/18   [provider]  metoCLOPramide (REGLAN) 10 MG tablet Take 1  tablet (10 mg total) by mouth 3 (three) times daily before meals. 01/19/18   Homero Fellers, MD  polyethylene glycol powder (GLYCOLAX/MIRALAX) powder One-half cap full in a full glass of water, once daily 02/23/18   Carrie Mew, MD  pramoxine (PROCTOFOAM) 1 % foam Place 1 application rectally 3 (three) times daily as needed for hemorrhoids. 02/23/18   Carrie Mew, MD  Witch Hazel (TUCKS) 50 % PADS Apply 1 application topically every 2 (two) hours as needed. 02/23/18   Carrie Mew, MD    Allergies Patient has no known allergies.  Family History  Problem Relation Age of Onset   Cancer Mother    Thyroid disease Mother    Cancer Father    Cancer Maternal Grandmother     Social History Social History   Tobacco Use   Smoking status: Current Every Day Smoker    Types: Cigarettes   Smokeless tobacco: Never Used  Substance Use Topics   Alcohol use: No   Drug use: No    Review of Systems Constitutional: No fever/chills Cardiovascular: Denies chest pain. Respiratory: Denies shortness of breath. Musculoskeletal: Positive right great toe pain. Skin: Negative for rash. Neurological: Negative for  focal weakness or numbness. ____________________________________________   PHYSICAL EXAM:  VITAL SIGNS: ED Triage Vitals  Enc Vitals Group     BP      Pulse      Resp      Temp      Temp src      SpO2  Weight      Height      Head Circumference      Peak Flow      Pain Score      Pain Loc      Pain Edu?      Excl. in GC?     Constitutional: Alert and oriented. Well appearing and in no acute distress. Eyes: Conjunctivae are normal.  Head: Atraumatic. Neck: No stridor.   Cardiovascular: Normal rate, regular rhythm. Grossly normal heart sounds.  Good peripheral circulation. Respiratory: Normal respiratory effort.  No retractions. Lungs CTAB. Musculoskeletal: Moves upper and lower extremities with any difficulty.  Normal gait  noted. Neurologic:  Normal speech and language. No gross focal neurologic deficits are appreciated. No gait instability. Skin:  Skin is warm, dry and intact.  Right great toenail has been cut significantly shorter.  No active drainage.  The base and lateral aspect is swollen and erythematous.  No obvious abscess present. Psychiatric: Mood and affect are normal. Speech and behavior are normal.  ____________________________________________   LABS (all labs ordered are listed, but only abnormal results are displayed)  Labs Reviewed - No data to display  PROCEDURES  Procedure(s) performed (including Critical Care):  Procedures   ____________________________________________   INITIAL IMPRESSION / ASSESSMENT AND PLAN / ED COURSE  As part of my medical decision making, I reviewed the following data within the electronic MEDICAL RECORD NUMBER Notes from prior ED visits and [place] Controlled Substance Database  33 year old female presents to the ED with complaint of right great toe pain for approximately 3 weeks.  On exam area appears to have a early paronychial infection along with a ingrown toenail.  Patient was placed on Keflex 500 mg 4 times daily and encouraged to continue soaking her toe and Epson salt.  She will return to the emergency department if any severe worsening.  She was also given the name of Dr. Ether Griffins who is the podiatrist on call and his contact information. ____________________________________________   FINAL CLINICAL IMPRESSION(S) / ED DIAGNOSES  Final diagnoses:  Infection of skin of toes     ED Discharge Orders         Ordered    cephALEXin (KEFLEX) 500 MG capsule  4 times daily     10/17/18 9528           Note:  This document was prepared using Dragon voice recognition software and may include unintentional dictation errors.    Tommi Rumps, PA-C 10/17/18 [id number]    Phineas Semen, MD 10/17/18 1045

## 2018-10-17 NOTE — Discharge Instructions (Addendum)
Follow-up with your primary care provider or call and make an appointment with Dr. Vickki Muff who is the podiatrist on call.  Continue soaking your foot in warm water with Epson salt.  Discontinue clipping your nails back short.  Begin taking antibiotics as directed.  You may also take Tylenol as needed for pain.

## 2018-12-06 IMAGING — CR DG CHEST 2V
1 series · 2 of 2 positions shown · non-contrast
Comparison: Two-view chest x-ray 05/25/2015.

CLINICAL DATA: The cough and chest pain.

EXAM:
CHEST  2 VIEW

[Series 1: dg chest 2 view · 0.14mm/px · 2 of 2 slices shown]
[im 1/2]
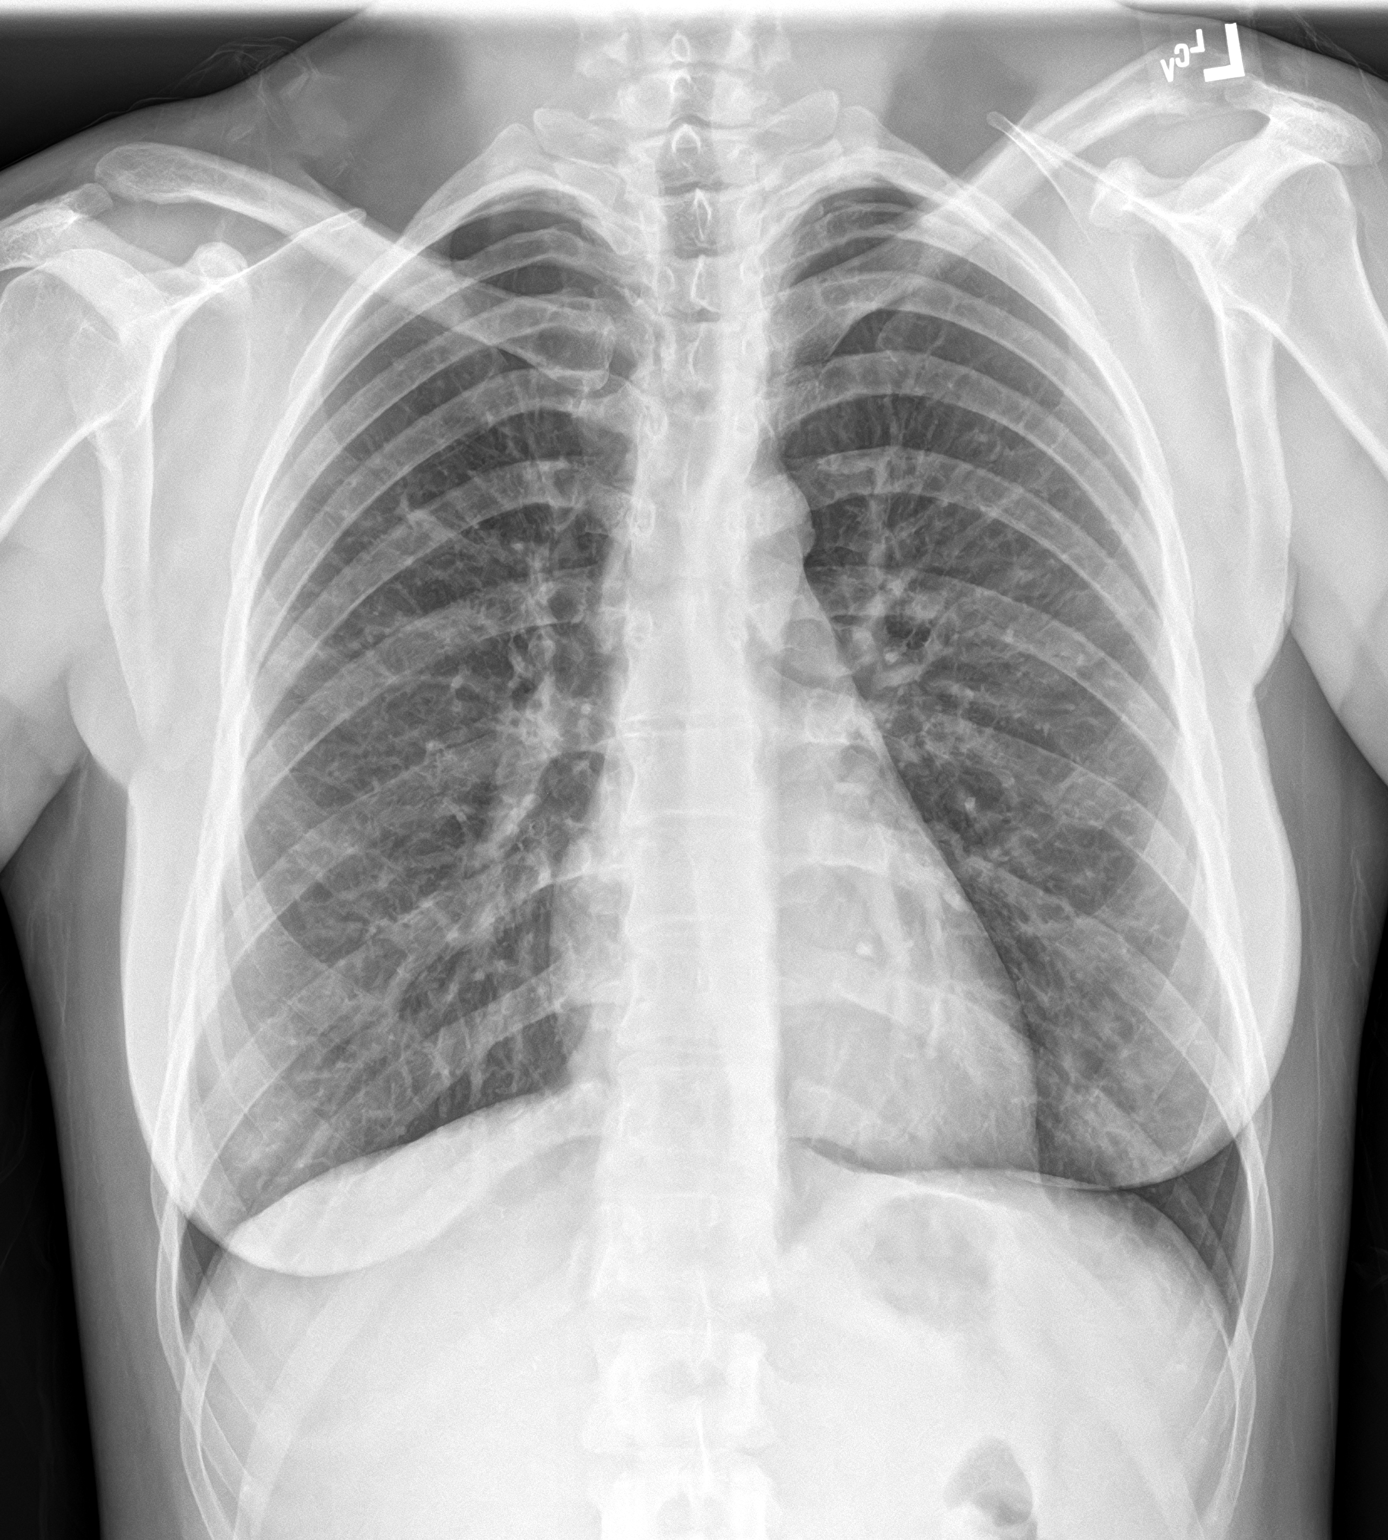
[im 2/2]
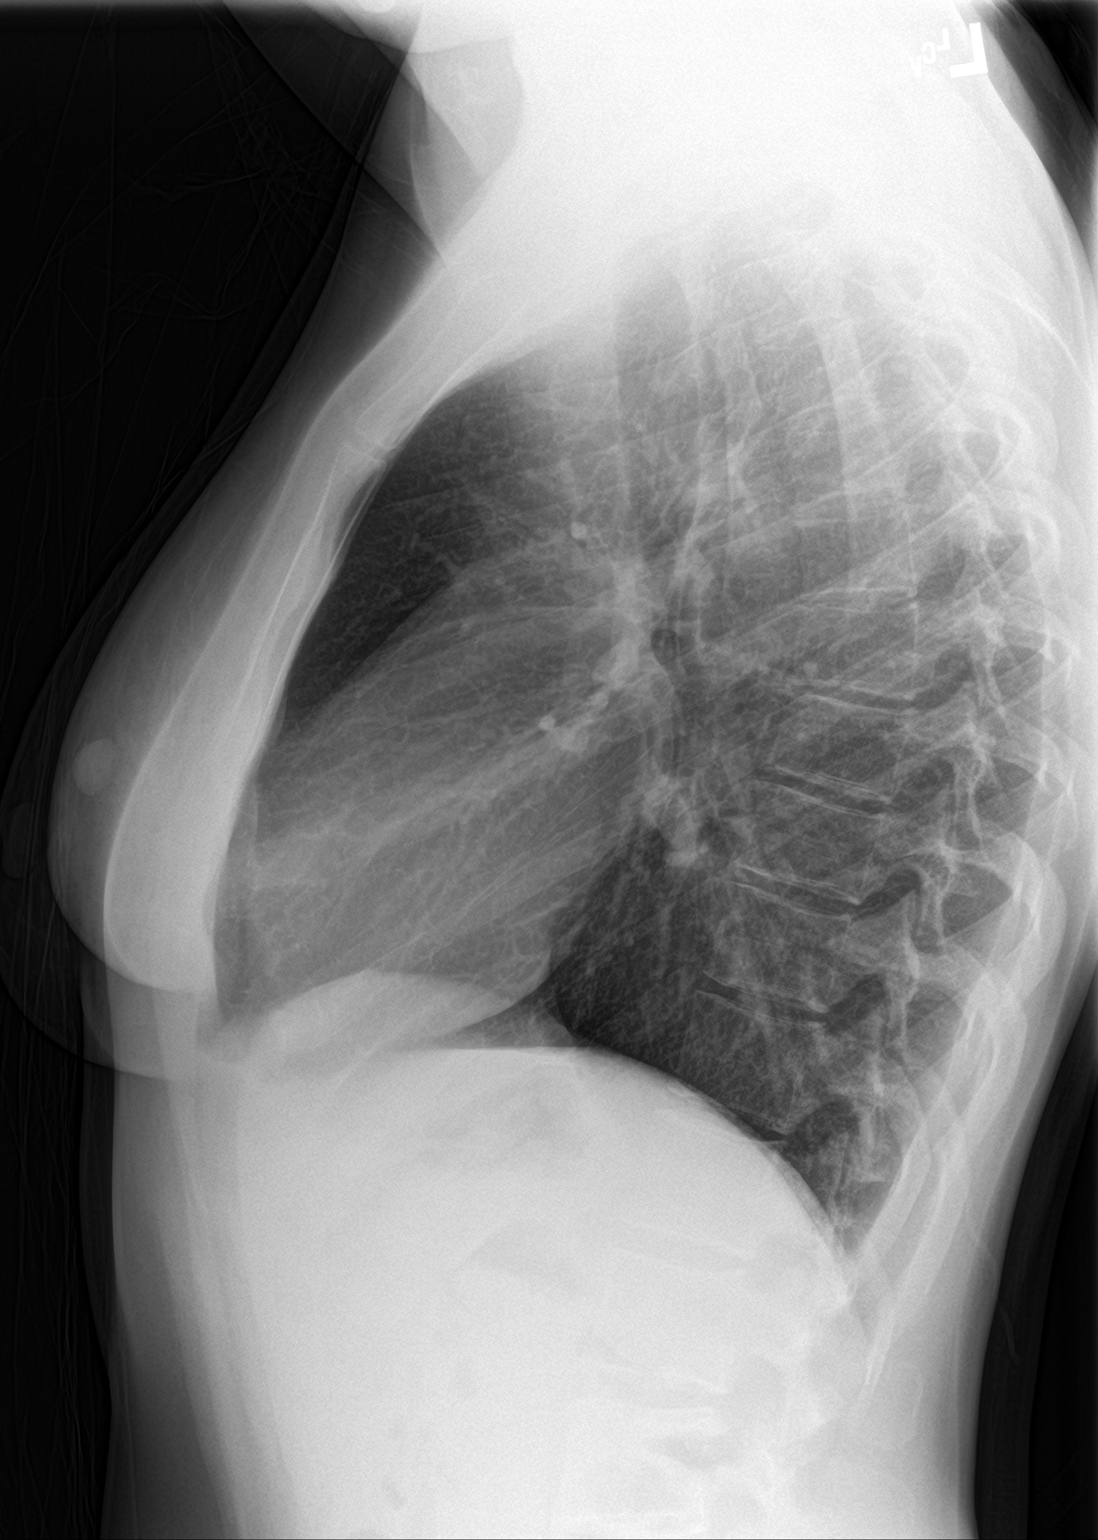

[2 of 2 positions shown; findings below may reference images not displayed]

FINDINGS: The heart size and mediastinal contours are within normal limits.
Both lungs are clear. The visualized skeletal structures are
unremarkable.
IMPRESSION: Negative two view chest x-ray.

## 2019-03-30 ENCOUNTER — Emergency Department: Payer: Medicaid Other

## 2019-03-30 ENCOUNTER — Other Ambulatory Visit: Payer: Self-pay

## 2019-03-30 ENCOUNTER — Emergency Department
Admission: EM | Admit: 2019-03-30 | Discharge: 2019-03-30 | Disposition: A | Discharge status: Home / Self Care | Payer: Medicaid Other | Attending: Emergency Medicine | Admitting: Emergency Medicine

## 2019-03-30 DIAGNOSIS — R319 Hematuria, unspecified: Secondary | ICD-10-CM | POA: Insufficient documentation

## 2019-03-30 DIAGNOSIS — Z20822 Contact with and (suspected) exposure to covid-19: Secondary | ICD-10-CM | POA: Insufficient documentation

## 2019-03-30 DIAGNOSIS — F17210 Nicotine dependence, cigarettes, uncomplicated: Secondary | ICD-10-CM | POA: Insufficient documentation

## 2019-03-30 DIAGNOSIS — M545 Low back pain: Secondary | ICD-10-CM | POA: Insufficient documentation

## 2019-03-30 DIAGNOSIS — M549 Dorsalgia, unspecified: Secondary | ICD-10-CM

## 2019-03-30 DIAGNOSIS — M546 Pain in thoracic spine: Secondary | ICD-10-CM | POA: Insufficient documentation

## 2019-03-30 LAB — CBC WITH DIFFERENTIAL/PLATELET
Abs Immature Granulocytes: 0.02 10*3/uL (ref 0.00–0.07)
Basophils Absolute: 0 10*3/uL (ref 0.0–0.1)
Basophils Relative: 1 %
Eosinophils Absolute: 0 10*3/uL (ref 0.0–0.5)
Eosinophils Relative: 0 %
HCT: 47.3 % — ABNORMAL HIGH (ref 36.0–46.0)
Hemoglobin: 15.9 g/dL — ABNORMAL HIGH (ref 12.0–15.0)
Immature Granulocytes: 0 %
Lymphocytes Relative: 22 %
Lymphs Abs: 1.8 10*3/uL (ref 0.7–4.0)
MCH: 31.4 pg (ref 26.0–34.0)
MCHC: 33.6 g/dL (ref 30.0–36.0)
MCV: 93.5 fL (ref 80.0–100.0)
Monocytes Absolute: 0.7 10*3/uL (ref 0.1–1.0)
Monocytes Relative: 8 %
Neutro Abs: 5.7 10*3/uL (ref 1.7–7.7)
Neutrophils Relative %: 69 %
Platelets: 359 10*3/uL (ref 150–400)
RBC: 5.06 MIL/uL (ref 3.87–5.11)
RDW: 13.4 % (ref 11.5–15.5)
WBC: 8 10*3/uL (ref 4.0–10.5)
nRBC: 0 % (ref 0.0–0.2)

## 2019-03-30 LAB — COMPREHENSIVE METABOLIC PANEL
ALT: 13 U/L (ref 0–44)
AST: 18 U/L (ref 15–41)
Albumin: 4.6 g/dL (ref 3.5–5.0)
Alkaline Phosphatase: 69 U/L (ref 38–126)
Anion gap: 9 (ref 5–15)
BUN: 11 mg/dL (ref 6–20)
CO2: 27 mmol/L (ref 22–32)
Calcium: 9.1 mg/dL (ref 8.9–10.3)
Chloride: 99 mmol/L (ref 98–111)
Creatinine, Ser: 0.89 mg/dL (ref 0.44–1.00)
GFR calc Af Amer: >60 mL/min (ref >60)
GFR calc non Af Amer: >60 mL/min (ref >60)
Glucose, Bld: 90 mg/dL (ref 70–99)
Potassium: 4 mmol/L (ref 3.5–5.1)
Sodium: 135 mmol/L (ref 135–145)
Total Bilirubin: 0.7 mg/dL (ref 0.3–1.2)
Total Protein: 8.5 g/dL — ABNORMAL HIGH (ref 6.5–8.1)

## 2019-03-30 LAB — URINALYSIS, COMPLETE (UACMP) WITH MICROSCOPIC
Bilirubin Urine: NEGATIVE
Glucose, UA: NEGATIVE mg/dL
Hgb urine dipstick: NEGATIVE
Ketones, ur: NEGATIVE mg/dL
Leukocytes,Ua: NEGATIVE
Nitrite: NEGATIVE
Protein, ur: 100 mg/dL — AB
Specific Gravity, Urine: 1.029 (ref 1.005–1.030)
pH: 5 (ref 5.0–8.0)

## 2019-03-30 LAB — INFLUENZA PANEL BY PCR (TYPE A & B)
Influenza A By PCR: NEGATIVE
Influenza B By PCR: NEGATIVE

## 2019-03-30 MED ORDER — CYCLOBENZAPRINE HCL 5 MG PO TABS: 5.0000 mg | ORAL_TABLET | Freq: Three times a day (TID) | ORAL | 0 refills | Status: AC | PRN

## 2019-03-30 MED ORDER — LIDOCAINE 5 % EX PTCH: 1.0000 | MEDICATED_PATCH | CUTANEOUS | 0 refills | Status: AC

## 2019-03-30 MED ORDER — ONDANSETRON HCL 4 MG/2ML IJ SOLN
4.0000 mg | Freq: Once | INTRAMUSCULAR | Status: AC
  Administered 2019-03-30: 4 mg via INTRAVENOUS
  Filled 2019-03-30: qty 2

## 2019-03-30 MED ORDER — KETOROLAC TROMETHAMINE 30 MG/ML IJ SOLN
30.0000 mg | Freq: Once | INTRAMUSCULAR | Status: AC
  Administered 2019-03-30: 30 mg via INTRAVENOUS
  Filled 2019-03-30: qty 1

## 2019-03-30 MED ORDER — HYDROCODONE-ACETAMINOPHEN 5-325 MG PO TABS: 1.0000 | ORAL_TABLET | Freq: Four times a day (QID) | ORAL | 0 refills | Status: AC | PRN

## 2019-03-30 MED ORDER — PREDNISONE 20 MG PO TABS: ORAL_TABLET | ORAL | 0 refills | Status: AC

## 2019-03-30 NOTE — ED Notes (Signed)
Pt transported to radiology for imaging.  

## 2019-03-30 NOTE — ED Triage Notes (Signed)
Pt c/o pain from neck all the way down her back for the past 10 days, denies injury,. Pt ambulatory with a steady gait.

## 2019-03-30 NOTE — ED Provider Notes (Signed)
[facility] Emergency Department Provider Note   ____________________________________________   First MD Initiated Contact with Patient 03/30/19 1422     (approximate)  I have reviewed the triage vital signs and the nursing notes.   HISTORY  Chief Complaint Back Pain   HPI Desiree Mosley is a 34 y.o. female presents to the ED with complaint of upper and lower back pain for the last 10 days.  Patient states that there has been no history of injury.  She states that she feels nauseous but no vomiting.  She is unaware of any fever but at times feels as if she has chills.  She denies any known exposure to Covid.  She denies any cough, shortness of breath, change in taste or smell.  There has been no injury to her back.  She rates her pain as an 8 out of 10.     Past Medical History:  Diagnosis Date  . 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 S/P abdominal supracervical subtotal hysterectomy 01/25/2018  . 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 Normal vaginal delivery of fourth pregnancy 01/16/2018  . Retained placenta 01/16/2018  . Postpartum care following vaginal delivery 01/16/2018  . Encounter for elective induction of labor 01/15/2018  . Indication for care in labor and delivery, antepartum 01/11/2018  . Supervision of other normal pregnancy, antepartum 06/04/2017    Past Surgical History:  Procedure Laterality Date  . ABDOMINAL HYSTERECTOMY  01/16/2018   Placenta increta  . DILATION AND CURETTAGE OF UTERUS N/A 01/16/2018   Procedure: ,D&C,abdominal hysterectomy;  Surgeon: Homero Fellers, MD;  Location: [facility] ORS;  Service: Gynecology;  Laterality: N/A;  . miscarrage      Prior to Admission medications   Medication Sig Start Date End Date Taking? Authorizing Provider  cyclobenzaprine (FLEXERIL) 5 MG tablet Take 1 tablet (5 mg total) by mouth 3 (three) times daily as  needed. 03/30/19   Menshew, Dannielle Karvonen, PA-C  HYDROcodone-acetaminophen (NORCO) 5-325 MG tablet Take 1 tablet by mouth every 6 (six) hours as needed for up to 3 days. 03/30/19 04/02/19  Menshew, Dannielle Karvonen, PA-C  lidocaine (LIDODERM) 5 % Place 1 patch onto the skin daily for 5 days. Remove & Discard patch after 12 hours of wear each day. 03/30/19 04/04/19  Menshew, Dannielle Karvonen, PA-C  predniSONE (DELTASONE) 20 MG tablet Take 3 tabs daily x 3 days; Take 2 tabs daily x 3 days; Take 1 tab daily x 3 days; Take 0.5 tab daily x 4 days 03/30/19   Menshew, Dannielle Karvonen, PA-C  enoxaparin (LOVENOX) 80 MG/0.8ML injection INJECT 70MG  2X A DAY 01/28/18 03/30/19  [provider]  metoCLOPramide (REGLAN) 10 MG tablet Take 1 tablet (10 mg total) by mouth 3 (three) times daily before meals. 01/19/18 03/30/19  Homero Fellers, MD    Allergies Patient has no known allergies.  Family History  Problem Relation Age of Onset  . Cancer Mother   . Thyroid disease Mother   . Cancer Father   . Cancer Maternal Grandmother     Social History Social History   Tobacco Use  . Smoking status: Current Every Day Smoker    Types: Cigarettes  . Smokeless tobacco: Never Used  Substance Use Topics  . Alcohol use: No  . Drug use: No    Review of Systems Constitutional: No fever/positive chills Eyes: No visual  changes. ENT: No sore throat. Cardiovascular: Denies chest pain. Respiratory: Denies shortness of breath.  Negative for cough. Gastrointestinal: No abdominal pain.  Positive nausea, no vomiting.  No diarrhea.  No constipation. Genitourinary: Negative for dysuria. Musculoskeletal: Positive for upper and lower back pain. Skin: Negative for rash. Neurological: Negative for headaches, focal weakness or numbness. ____________________________________________   PHYSICAL EXAM:  VITAL SIGNS: ED Triage Vitals  Enc Vitals Group     BP 03/30/19 1334 109/78     Pulse Rate 03/30/19 1334 (!) 114      Resp 03/30/19 1334 16     Temp 03/30/19 1334 98.9 F (37.2 C)     Temp Source 03/30/19 1334 Oral     SpO2 03/30/19 1334 100 %     Weight 03/30/19 1333 165 lb (74.8 kg)     Height 03/30/19 1333 5\' 6"  (1.676 m)     Head Circumference --      Peak Flow --      Pain Score 03/30/19 1333 8     Pain Loc --      Pain Edu? --      Excl. in GC? --    Constitutional: Alert and oriented. Well appearing and in no acute distress. Eyes: Conjunctivae are normal.  Head: Atraumatic. Nose: No congestion/rhinnorhea. Mouth/Throat: Mask in place Neck: No stridor.   Cardiovascular: Normal rate, regular rhythm. Grossly normal heart sounds.  Good peripheral circulation. Respiratory: Normal respiratory effort.  No retractions. Lungs CTAB. Gastrointestinal: Soft and nontender. No distention.  Bowel sounds are normoactive x4 quadrants. Musculoskeletal: On examination of the back there is no gross deformity however there is moderate tenderness on palpation of the thoracic and lumbar spine but no step-offs are noted.  No discoloration of the skin are noted and no soft tissue edema.  Patient is able to move upper and lower extremities without any difficulty.  She is able to ambulate without assistance. Neurologic:  Normal speech and language. No gross focal neurologic deficits are appreciated. No gait instability. Skin:  Skin is warm, dry and intact. No rash noted. Psychiatric: Mood and affect are normal. Speech and behavior are normal.  ____________________________________________   LABS (all labs ordered are listed, but only abnormal results are displayed)  Labs Reviewed  URINALYSIS, COMPLETE (UACMP) WITH MICROSCOPIC - Abnormal; Notable for the following components:      Result Value   Color, Urine YELLOW (*)    APPearance HAZY (*)    Protein, ur 100 (*)    Bacteria, UA RARE (*)    All other components within normal limits  CBC WITH DIFFERENTIAL/PLATELET - Abnormal; Notable for the following  components:   Hemoglobin 15.9 (*)    HCT 47.3 (*)    All other components within normal limits  COMPREHENSIVE METABOLIC PANEL - Abnormal; Notable for the following components:   Total Protein 8.5 (*)    All other components within normal limits  SARS CORONAVIRUS 2 (TAT 6-24 HRS)  INFLUENZA PANEL BY PCR (TYPE A & B)    RADIOLOGY   Official radiology report(s): DG Chest Portable 1 View  Result Date: 03/30/2019 CLINICAL DATA:  Chills and back pain. Chest radiograph 06/23/2016. Lung bases from abdominal CT earlier today. EXAM: PORTABLE CHEST 1 VIEW COMPARISON:  None. FINDINGS: The cardiomediastinal contours are normal. The lungs are clear. Pulmonary vasculature is normal. No consolidation, pleural effusion, or pneumothorax. No acute osseous abnormalities are seen. IMPRESSION: Negative portable AP view of the chest. Electronically Signed   By: 06/25/2016  Sanford M.D.   On: 03/30/2019 17:25   CT Renal Stone Study  Result Date: 03/30/2019 CLINICAL DATA:  34 year old female with back pain for 10 days. Unexplained hematuria. EXAM: CT ABDOMEN AND PELVIS WITHOUT CONTRAST TECHNIQUE: Multidetector CT imaging of the abdomen and pelvis was performed following the standard protocol without IV contrast. COMPARISON:  CT Abdomen and Pelvis with contrast 01/28/2018. FINDINGS: Lower chest: Negative, aside from minimal linear scarring or atelectasis in the left posterior costophrenic angle. Hepatobiliary: Negative noncontrast liver and gallbladder. Pancreas: Negative. Spleen: Negative. Adrenals/Urinary Tract: Normal adrenal glands. Partial renal atrophy appears stable with renal cortical volume loss particularly in the upper and lower poles. No left nephrolithiasis, hydronephrosis or perinephric stranding. The right kidney is larger and appears more normal. There is mild chronic right renal upper pole scarring. No right nephrolithiasis, hydronephrosis or perinephric stranding. Both proximal ureters appear decompressed.  Decompressed and unremarkable urinary bladder. No urinary calculus identified. Stomach/Bowel: Negative rectum. Redundant but otherwise negative sigmoid colon. The left colon is within normal limits. The transverse colon is mildly redundant. Negative right colon, with normal appendix visible on coronal image 58. Decompressed terminal ileum and no dilated small bowel. Unremarkable stomach. No free air. No free fluid in the abdomen. Vascular/Lymphatic: Vascular patency is not evaluated in the absence of IV contrast. No atherosclerosis identified. No lymphadenopathy. Reproductive: Negative noncontrast appearance. Other: Small volume of pelvic free fluid with simple fluid density, increased compared to last year. Musculoskeletal: No significant degenerative changes identified in the spine. Mild degenerative appearing sclerosis of the right SI joint is stable. No acute osseous abnormality identified. IMPRESSION: 1. No urinary calculus or obstructive uropathy. 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 2. Small volume of pelvic free fluid is nonspecific, and increased compared to a CT last year, but still probably physiologic in this age group. 3. Normal appendix. Electronically Signed   By: Odessa Fleming M.D.   On: 03/30/2019 15:50    ____________________________________________   PROCEDURES  Procedure(s) performed (including Critical Care):  Procedures   ____________________________________________   INITIAL IMPRESSION / ASSESSMENT AND PLAN / ED COURSE  As part of my medical decision making, I reviewed the following data within the electronic MEDICAL RECORD NUMBER Notes from prior ED visits and [place] Controlled Substance Database  34 year old female presents to the ED with complaint of upper and lower back pain without history of injury.  She states that the pain has been going on for the last 10 days.  She denies any urinary symptoms, vomiting or diarrhea.  There has been no fever but she does  report chills.  No reported Covid exposure.  Urinalysis does show RBCs and patient is status post hysterectomy.  CT results failed to show an acute stone or obstruction.  There were scars noted on CT scan of the kidneys.  Remaining lab work was reassuring.  Patient was given Toradol 30 mg IV.  An influenza,  Covid swab and chest x-ray was performed and was pending.  Patient care was turned over to [facility], PA-C since influenza test was pending.  We have discussed a treatment plan provided that her influenza test is negative.  ____________________________________________   FINAL CLINICAL IMPRESSION(S) / ED DIAGNOSES  Final diagnoses:  Acute midline back pain, unspecified back location     ED Discharge Orders         Ordered    predniSONE (DELTASONE) 20 MG tablet     03/30/19 1717    cyclobenzaprine (FLEXERIL) 5 MG tablet  3 times daily PRN     03/30/19 1717    lidocaine (LIDODERM) 5 %  Every 24 hours     03/30/19 1728    HYDROcodone-acetaminophen (NORCO) 5-325 MG tablet  Every 6 hours PRN     03/30/19 1737           Note:  This document was prepared using Dragon voice recognition software and may include unintentional dictation errors.    Tommi Rumps, PA-C 03/31/19 1436    Emily Filbert, MD 03/31/19 [phone]

## 2019-03-30 NOTE — Discharge Instructions (Addendum)
Your exam, labs, CT scan and X-rays are negative for any findings or infections. Your influenza and COVID tests are pending at this time. The treatment for either of these viral illnesses is based on symptoms. Take OTC cough medicine as needed. Take the prescription steroid and muscle relaxant as needed. Follow-up with [facility] or return as needed.

## 2019-03-30 NOTE — ED Provider Notes (Signed)
  See original H&P.   Assuming care from R. Levada Schilling, PA-C. Patent with complaint of LBP/flank pain presents for evaluation. Await CXR results for final disposition.   Physical Exam  BP 109/78 (BP Location: Left Arm)   Pulse (!) 114   Temp 98.9 F (37.2 C) (Oral)   Resp 16   Ht 5\' 6"  (1.676 m)   Wt 74.8 kg   LMP 01/16/2018   SpO2 100%   BMI 26.63 kg/m   Physical Exam Constitutional:      Appearance: Normal appearance.  HENT:     Head: Normocephalic and atraumatic.  Musculoskeletal:        General: Tenderness present. Normal range of motion.     Cervical back: Normal range of motion.  Skin:    General: Skin is warm and dry.  Neurological:     General: No focal deficit present.     Mental Status: She is alert.     ED Course/Procedures     Procedures  MDM  Patient with ED evaluation of LBP & flank pain. Exam, labs, EKG, and imaging are all WNL. CT scan does not reveal any acute findings. CXR did not reveal any acute infectious etiology. She reports some improvement of symptoms with Toradol and Zofran. She will be referred to Urology for her concern over the incidental findings of liver scarring. Prescriptions for Flexeril, Prednisone, Lidoderm patches, and Norco (10) are provided. She will follow-up with [facility] or return as needed.        [facility], PA-C 03/31/19 1012    04/02/19, MD 04/02/19 [phone]

## 2019-03-31 LAB — SARS CORONAVIRUS 2 (TAT 6-24 HRS): SARS Coronavirus 2: NEGATIVE

## 2019-05-05 ENCOUNTER — Ambulatory Visit: Payer: Self-pay | Admitting: Urology

## 2019-05-19 ENCOUNTER — Ambulatory Visit: Payer: Self-pay | Admitting: Urology

## 2019-05-19 ENCOUNTER — Encounter: Payer: Self-pay | Admitting: Urology

## 2019-10-29 IMAGING — US US OB COMP LESS 14 WK
1 series · 14 of 28 positions shown · non-contrast
Comparison: None.

CLINICAL DATA: Vaginal bleeding.  Pregnant patient.

EXAM:
OBSTETRIC <14 WK US AND TRANSVAGINAL OB US
TECHNIQUE: Both transabdominal and transvaginal ultrasound examinations were
performed for complete evaluation of the gestation as well as the
maternal uterus, adnexal regions, and pelvic cul-de-sac.
Transvaginal technique was performed to assess early pregnancy.

[Series 1: us ob comp less 14 wk · 14 of 51 slices shown]
[im 2/51]
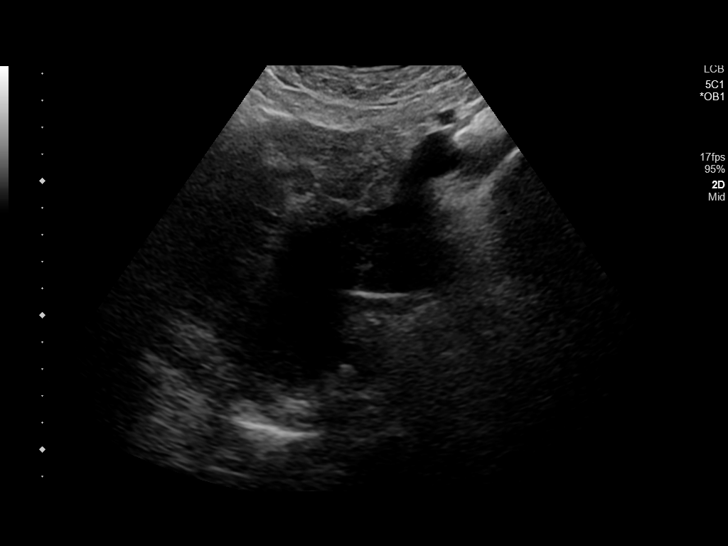
[im 6/51]
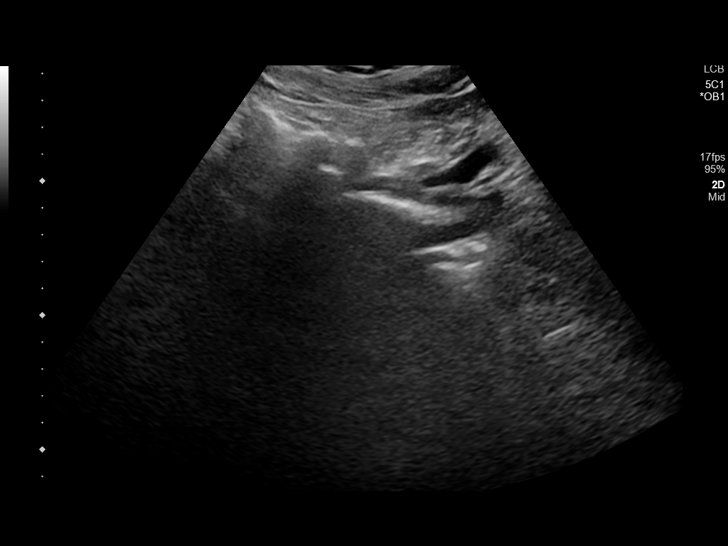
[im 10/51]
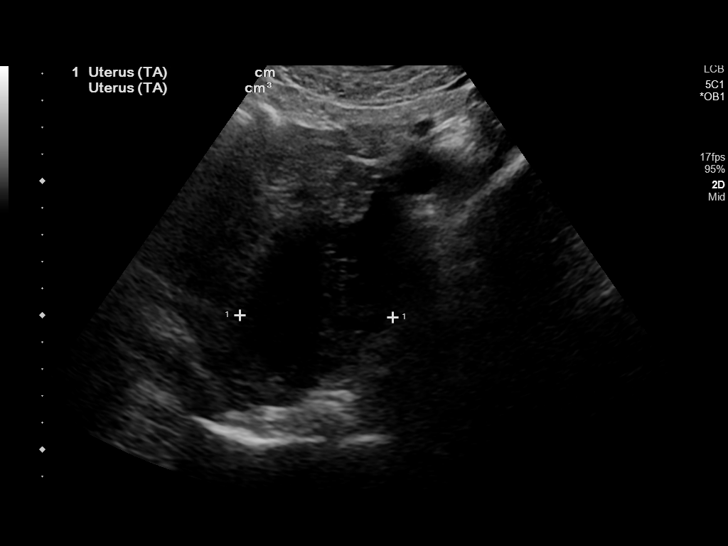
[im 13/51]
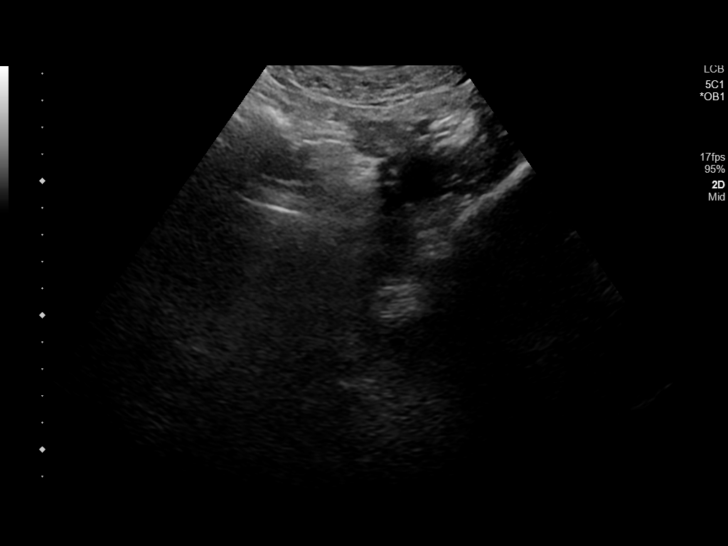
[im 17/51]
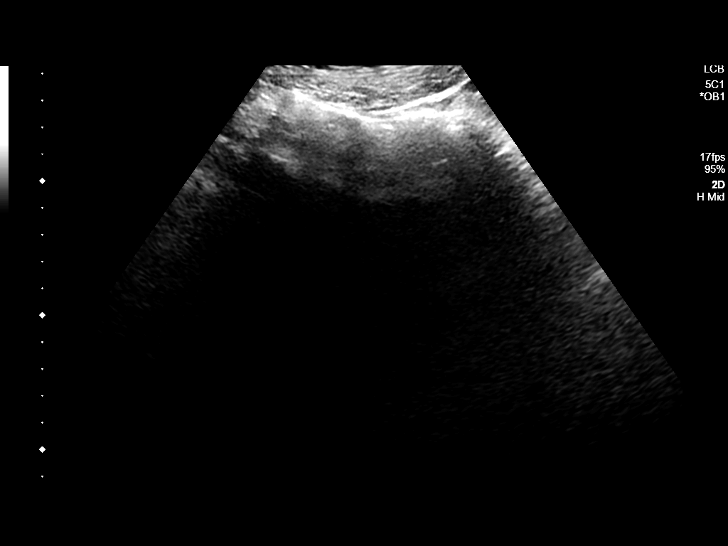
[im 21/51]
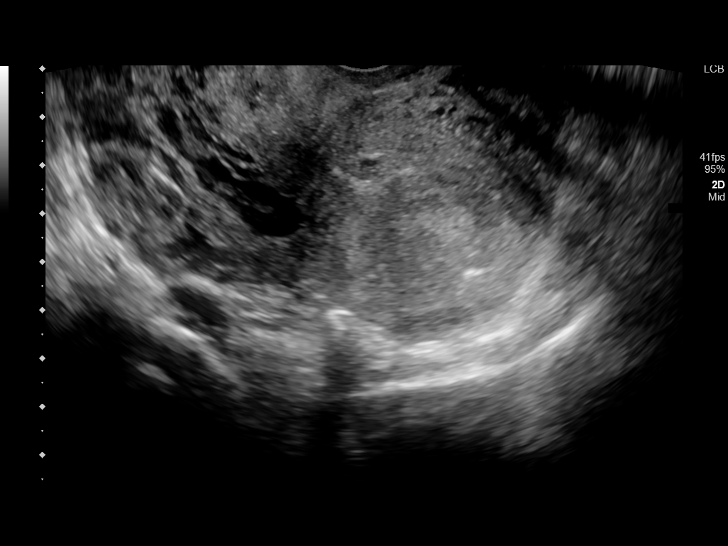
[im 25/51]
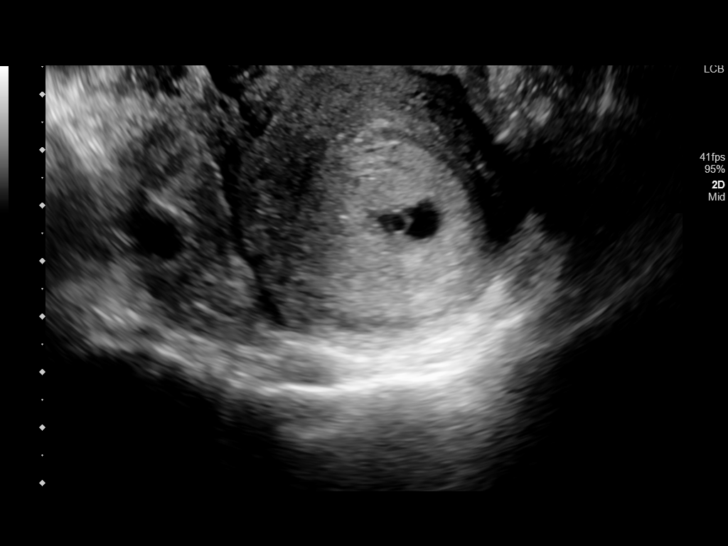
[im 28/51]
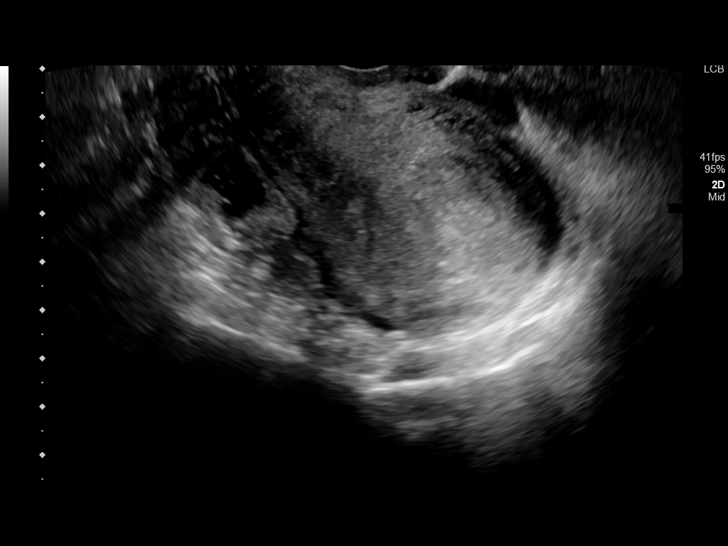
[im 32/51]
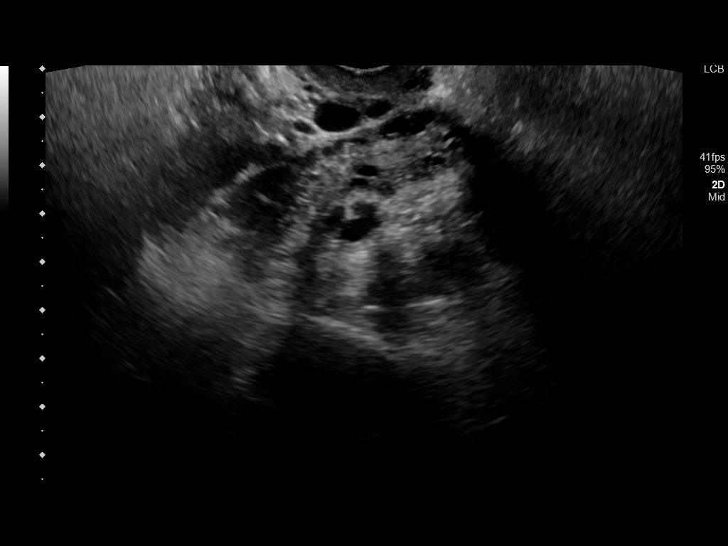
[im 36/51]
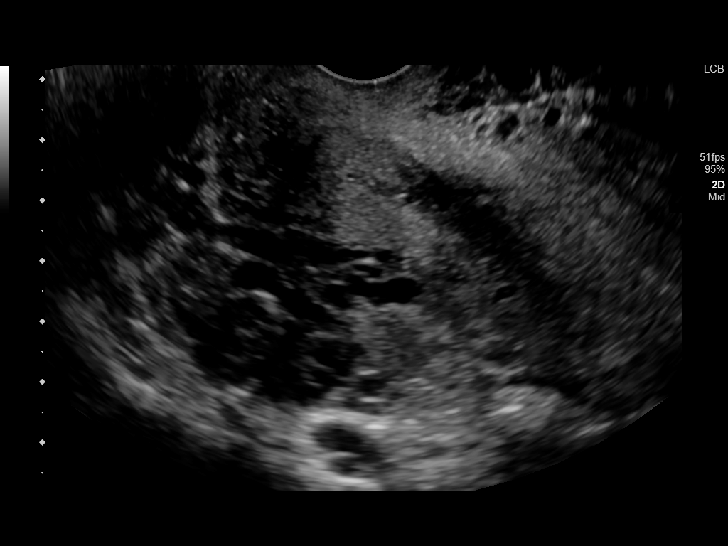
[im 39/51]
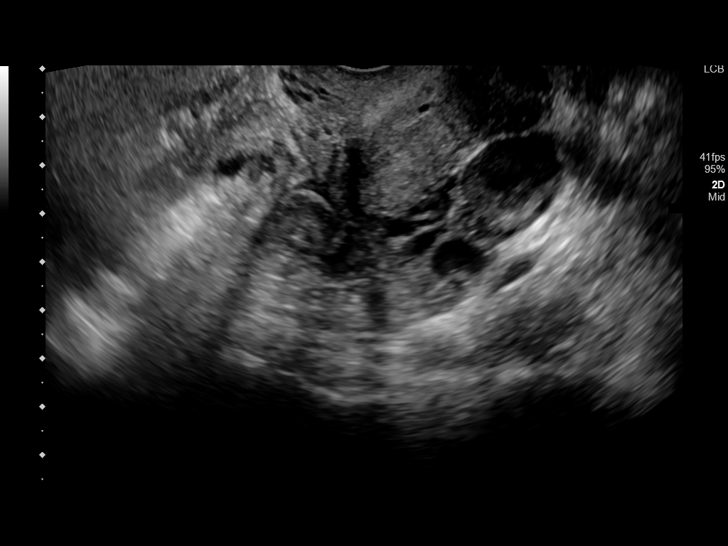
[im 43/51]
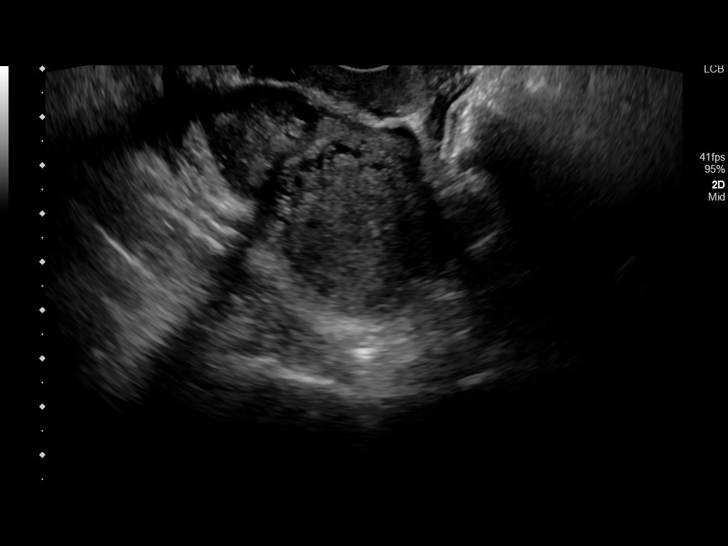
[im 47/51]
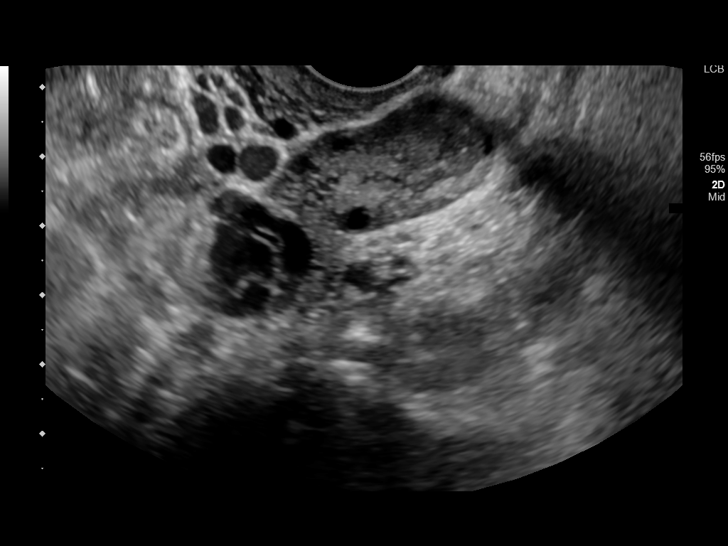
[im 51/51]
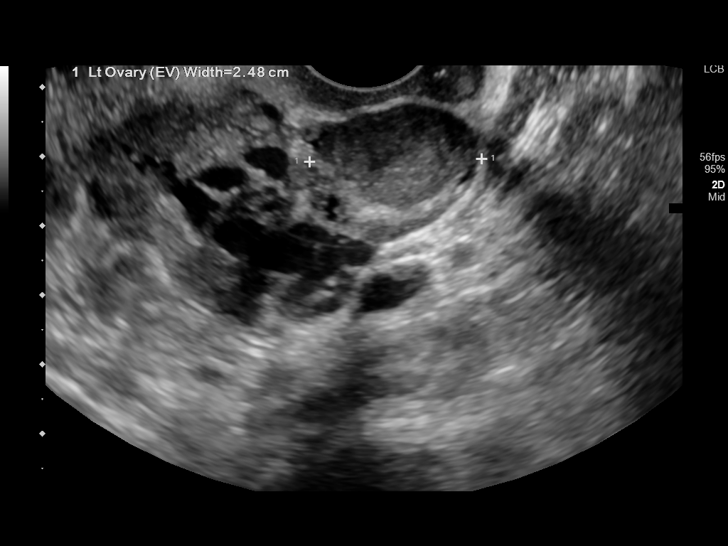

[14 of 28 positions shown; findings below may reference images not displayed]

FINDINGS: Intrauterine gestational sac: Single

Yolk sac:  Visualized.

Embryo:  Not Visualized.

Cardiac Activity: Not Visualized.

MSD: 11 mm   6 w   0 d

CRL:    mm    w    d                  US EDC:

Subchorionic hemorrhage:  None visualized.

Maternal uterus/adnexae: Normal ovaries.
IMPRESSION: 1. An IUP is identified with a gestational sac and a yolk sac. No
fetal pole identified at this time. Recommend attention on
follow-up. No cause for bleeding noted.

## 2020-03-04 ENCOUNTER — Other Ambulatory Visit: Payer: Self-pay

## 2020-03-04 ENCOUNTER — Emergency Department
Admission: EM | Admit: 2020-03-04 | Discharge: 2020-03-04 | Disposition: A | Discharge status: Home / Self Care | Payer: Medicaid Other | Attending: Emergency Medicine | Admitting: Emergency Medicine

## 2020-03-04 DIAGNOSIS — Y92513 Shop (commercial) as the place of occurrence of the external cause: Secondary | ICD-10-CM | POA: Diagnosis not present

## 2020-03-04 DIAGNOSIS — M7711 Lateral epicondylitis, right elbow: Secondary | ICD-10-CM | POA: Diagnosis not present

## 2020-03-04 DIAGNOSIS — W240XXA Contact with lifting devices, not elsewhere classified, initial encounter: Secondary | ICD-10-CM | POA: Insufficient documentation

## 2020-03-04 DIAGNOSIS — F17210 Nicotine dependence, cigarettes, uncomplicated: Secondary | ICD-10-CM | POA: Diagnosis not present

## 2020-03-04 DIAGNOSIS — G5603 Carpal tunnel syndrome, bilateral upper limbs: Secondary | ICD-10-CM | POA: Insufficient documentation

## 2020-03-04 DIAGNOSIS — M7712 Lateral epicondylitis, left elbow: Secondary | ICD-10-CM | POA: Diagnosis not present

## 2020-03-04 DIAGNOSIS — M79601 Pain in right arm: Secondary | ICD-10-CM | POA: Diagnosis present

## 2020-03-04 DIAGNOSIS — Y990 Civilian activity done for income or pay: Secondary | ICD-10-CM | POA: Insufficient documentation

## 2020-03-04 MED ORDER — MELOXICAM 15 MG PO TABS: 15.0000 mg | ORAL_TABLET | Freq: Every day | ORAL | 0 refills | Status: AC

## 2020-03-04 MED ORDER — MELOXICAM 7.5 MG PO TABS
15.0000 mg | ORAL_TABLET | Freq: Once | ORAL | Status: AC
  Administered 2020-03-04: 15 mg via ORAL
  Filled 2020-03-04: qty 2

## 2020-03-04 NOTE — ED Triage Notes (Signed)
Pt states right arm pain that started 5 months ago after using a paddle jack. Pt states that the pain is constant and she has tingling. Pt states the are it is the worse is the forearm to the elbow.  Pt noted to be moving arms in triage, and using right arm with cell phone.

## 2020-03-04 NOTE — ED Notes (Signed)
Patient declined discharge vital signs. 

## 2020-03-04 NOTE — Discharge Instructions (Addendum)
Please use anti-inflammatory as prescribed.  Follow-up with orthopedics if not improving.

## 2020-03-04 NOTE — ED Provider Notes (Signed)
[facility] Emergency Department Provider Note  ____________________________________________   Event Date/Time   First MD Initiated Contact with Patient 03/04/20 1605     (approximate)  I have reviewed the triage vital signs and the nursing notes.   HISTORY  Chief Complaint Arm Pain  HPI Desiree Mosley is a 35 y.o. female who presents to the emergency department for evaluation of bilateral arm pain that has been present for the last 6 months.  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  Pain was initially located to the lateral aspect of the right elbow.  Patient states that over time, pain is now progressed to be the lateral aspect of both elbows as well as numbness and tingling in her hands that is worse at night.  She now works a job at [employer] and notes that she has occasionally dropped pans due to weakness in her hands.  She denies any neck pain or trauma, denies pain or paresthesias in the shoulders.  Has not tried any alleviating measures.        Past Medical History:  Diagnosis Date  . 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 S/P abdominal supracervical subtotal hysterectomy 01/25/2018  . 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 Normal vaginal delivery of fourth pregnancy 01/16/2018  . Retained placenta 01/16/2018  . Postpartum care following vaginal delivery 01/16/2018  . Encounter for elective induction of labor 01/15/2018  . Indication for care in labor and delivery, antepartum 01/11/2018  . Supervision of other normal pregnancy, antepartum 06/04/2017    Past Surgical History:  Procedure Laterality Date  . ABDOMINAL HYSTERECTOMY  01/16/2018   Placenta increta  . DILATION AND CURETTAGE OF UTERUS N/A 01/16/2018   Procedure: ,D&C,abdominal hysterectomy;  Surgeon: Natale Milch, MD;  Location: [facility] ORS;  Service:  Gynecology;  Laterality: N/A;  . miscarrage      Prior to Admission medications   Medication Sig Start Date End Date Taking? Authorizing Provider  meloxicam (MOBIC) 15 MG tablet Take 1 tablet (15 mg total) by mouth daily for 15 days. 03/04/20 03/19/20 Yes Glendi Mohiuddin, Ruben Gottron, PA  cyclobenzaprine (FLEXERIL) 5 MG tablet Take 1 tablet (5 mg total) by mouth 3 (three) times daily as needed. 03/30/19   Menshew, Charlesetta Ivory, PA-C  predniSONE (DELTASONE) 20 MG tablet Take 3 tabs daily x 3 days; Take 2 tabs daily x 3 days; Take 1 tab daily x 3 days; Take 0.5 tab daily x 4 days 03/30/19   Menshew, Charlesetta Ivory, PA-C  enoxaparin (LOVENOX) 80 MG/0.8ML injection INJECT 70MG  2X A DAY 01/28/18 03/30/19  [provider]  metoCLOPramide (REGLAN) 10 MG tablet Take 1 tablet (10 mg total) by mouth 3 (three) times daily before meals. 01/19/18 03/30/19  04/01/19, MD    Allergies Patient has no known allergies.  Family History  Problem Relation Age of Onset  . Cancer Mother   . Thyroid disease Mother   . Cancer Father   . Cancer Maternal Grandmother     Social History Social History   Tobacco Use  . Smoking status: Current Every Day Smoker    Types: Cigarettes  . Smokeless tobacco: Never Used  Vaping Use  . Vaping Use: Never used  Substance Use Topics  . Alcohol use: No  . Drug use: No  Review of Systems Constitutional: No fever/chills Eyes: No visual changes. ENT: No sore throat. Cardiovascular: Denies chest pain. Respiratory: Denies shortness of breath. Gastrointestinal: No abdominal pain.  No nausea, no vomiting.  No diarrhea.  No constipation. Genitourinary: Negative for dysuria. Musculoskeletal: + Bilateral elbow pain, + bilateral hand tingling and numbness, negative for back pain. Skin: Negative for rash. Neurological: Negative for headaches, focal weakness or numbness.   ____________________________________________   PHYSICAL EXAM:  VITAL SIGNS: ED Triage  Vitals  Enc Vitals Group     BP 03/04/20 1550 (!) 109/91     Pulse Rate 03/04/20 1550 78     Resp 03/04/20 1550 16     Temp 03/04/20 1550 98.2 F (36.8 C)     Temp src --      SpO2 03/04/20 1550 100 %     Weight 03/04/20 1551 165 lb (74.8 kg)     Height 03/04/20 1551 5\' 6"  (1.676 m)     Head Circumference --      Peak Flow --      Pain Score 03/04/20 1551 7     Pain Loc --      Pain Edu? --      Excl. in GC? --    Constitutional: Alert and oriented. Well appearing and in no acute distress. Eyes: Conjunctivae are normal. PERRL. EOMI. Head: Atraumatic. Mouth/Throat: Mucous membranes are moist.   Neck: No stridor.  No tenderness to palpation of the midline or paraspinals of the cervical spine.  Full range of motion without difficulty. Cardiovascular: Normal rate, regular rhythm. Grossly normal heart sounds.  Good peripheral circulation. Respiratory: Normal respiratory effort.  No retractions. Lungs CTAB. Musculoskeletal: There is tenderness to palpation of the bilateral lateral epicondyles and extensor muscle bellies.  Patient maintains 5/5 strength in the bilateral wrist with flexion and extension, but notes reproduction of symptoms with resisted extension.  Full elbow range of motion bilaterally without difficulty.  Patient has positive Tinel's bilaterally, positive Phalen's.  No thenar or hypothenar atrophy noted.  Grip strength equal and strong bilaterally.  Radial pulse 2+ bilaterally, capillary refill less than 3 seconds all digits. Neurologic:  Normal speech and language. No gross focal neurologic deficits are appreciated. No gait instability. Skin:  Skin is warm, dry and intact. No rash noted. Psychiatric: Mood and affect are normal. Speech and behavior are normal.  ____________________________________________   INITIAL IMPRESSION / ASSESSMENT AND PLAN / ED COURSE  As part of my medical decision making, I reviewed the following data within the electronic MEDICAL RECORD NUMBER  Nursing notes reviewed and incorporated and Notes from prior ED visits        Patient is a 35 year old female who presents to the emergency department for evaluation of 6 months of worsening elbow pain and paresthesias of the bilateral hands.  See HPI for further details.  In triage, the patient has grossly normal vital signs.  On physical exam, the patient does have tenderness palpation of the lateral epicondyles bilaterally as well as the wrist extensor muscle belly.  Positive Phalen and Tinel's as well.  Given no history of trauma as well as gradual onset of symptoms and gradual worsening, suspect this to be related to wrist extensor tendinitis/tennis elbow as well as carpal tunnel given positive Phalen's and Tinel's and worsening of the symptoms at nighttime.  Will initiate treatment with anti-inflammatory and encouraged patient to purchase over-the-counter tennis elbow braces.  Encouraged orthopedics follow-up for further evaluation.  Patient is amenable with this plan  at this time and is stable at this point for outpatient follow-up.      ____________________________________________   FINAL CLINICAL IMPRESSION(S) / ED DIAGNOSES  Final diagnoses:  Lateral epicondylitis of both elbows  Bilateral carpal tunnel syndrome     ED Discharge Orders         Ordered    meloxicam (MOBIC) 15 MG tablet  Daily        03/04/20 1616          *Please note:  Desiree Mosley was evaluated in Emergency Department on 03/04/2020 for the symptoms described in the history of present illness. She was evaluated in the context of the global COVID-19 pandemic, which necessitated consideration that the patient might be at risk for infection with the SARS-CoV-2 virus that causes COVID-19. Institutional protocols and algorithms that pertain to the evaluation of patients at risk for COVID-19 are in a state of rapid change based on information released by regulatory bodies including the CDC and federal and state  organizations. These policies and algorithms were followed during the patient's care in the ED.  Some ED evaluations and interventions may be delayed as a result of limited staffing during and the pandemic.*   Note:  This document was prepared using Dragon voice recognition software and may include unintentional dictation errors.   Lucy Chris, PA 03/04/20 Julio Sicks    Sharyn Creamer, MD 03/04/20 2104

## 2020-07-12 IMAGING — CT CT ABD-PELV W/ CM
1 series · 1 of 1 positions shown · IV contrast (omnipaque)
Comparison: CT abdomen and pelvis February 14, 2015

CLINICAL DATA: Abdominal and flank pain. History of vaginal
delivery January 16, 2018.

EXAM:
CT ABDOMEN AND PELVIS WITH CONTRAST
TECHNIQUE: Multidetector CT imaging of the abdomen and pelvis was performed
using the standard protocol following bolus administration of
intravenous contrast.
CONTRAST:  100mL OMNIPAQUE IOHEXOL 300 MG/ML  SOLN

[Series 1: topogram 0.6 t20f · coronal · 1.00mm/px · 1 of 1 slices shown]
[im 1/1]
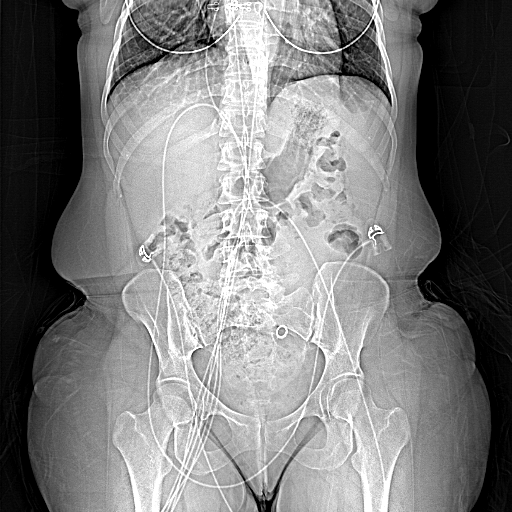

[1 of 1 positions shown; findings below may reference images not displayed]

FINDINGS: LOWER CHEST: Lung bases are clear. Included heart size is normal. No
pericardial effusion.

HEPATOBILIARY: Liver and gallbladder are normal.

PANCREAS: Normal.

SPLEEN: Normal.

ADRENALS/URINARY TRACT: Kidneys are orthotopic, demonstrating
symmetric enhancement. Atrophic LEFT kidney with renal scarring and
malrotation. Stable RIGHT upper pole calyx fills with contracts on
delayed phase, similar findings LEFT upper pole. No nephrolithiasis,
hydronephrosis or solid renal masses. The unopacified ureters are
normal in course and caliber. Delayed imaging through the kidneys
demonstrates symmetric prompt contrast excretion within the proximal
urinary collecting system. Urinary bladder is partially distended
and unremarkable. Normal adrenal glands.

STOMACH/BOWEL: The stomach, small and large bowel are normal in
course and caliber without inflammatory changes. Small volume
retained large bowel stool. Normal appendix.

VASCULAR/LYMPHATIC: Aortoiliac vessels are normal in course and
caliber. No lymphadenopathy by CT size criteria. RIGHT ovarian vein
X acute thrombosis as evidence by central filling defect (series 2,
image 50 and series 5, image 44).

REPRODUCTIVE: Ill-defined uterus, possible partial hysterectomy.

OTHER: Small volume free fluid may be physiologic. No
intraperitoneal free air.

MUSCULOSKELETAL: Nonacute.  Anterior pelvic wall edema versus scar.
IMPRESSION: 1. Acute RIGHT ovarian vein thrombosis.
2. No acute renal pathology.  Atrophic and scarred LEFT kidney.
3. Acute findings discussed with and reconfirmed by Dr.GUADARRAMA

## 2023-10-01 ENCOUNTER — Emergency Department

## 2023-10-01 ENCOUNTER — Other Ambulatory Visit: Payer: Self-pay

## 2023-10-01 ENCOUNTER — Encounter: Payer: Self-pay | Admitting: *Deleted

## 2023-10-01 ENCOUNTER — Emergency Department
Admission: EM | Admit: 2023-10-01 | Discharge: 2023-10-01 | Disposition: A | Discharge status: Home / Self Care | Attending: Emergency Medicine | Admitting: Emergency Medicine

## 2023-10-01 DIAGNOSIS — R079 Chest pain, unspecified: Secondary | ICD-10-CM | POA: Diagnosis present

## 2023-10-01 DIAGNOSIS — R509 Fever, unspecified: Secondary | ICD-10-CM | POA: Insufficient documentation

## 2023-10-01 DIAGNOSIS — R0789 Other chest pain: Secondary | ICD-10-CM | POA: Insufficient documentation

## 2023-10-01 DIAGNOSIS — R531 Weakness: Secondary | ICD-10-CM | POA: Insufficient documentation

## 2023-10-01 DIAGNOSIS — R002 Palpitations: Secondary | ICD-10-CM | POA: Insufficient documentation

## 2023-10-01 LAB — COMPREHENSIVE METABOLIC PANEL WITH GFR
ALT: 13 U/L (ref 0–44)
AST: 20 U/L (ref 15–41)
Albumin: 4.4 g/dL (ref 3.5–5.0)
Alkaline Phosphatase: 58 U/L (ref 38–126)
Anion gap: 13 (ref 5–15)
BUN: 20 mg/dL (ref 6–20)
CO2: 22 mmol/L (ref 22–32)
Calcium: 9.3 mg/dL (ref 8.9–10.3)
Chloride: 100 mmol/L (ref 98–111)
Creatinine, Ser: 1.01 mg/dL — ABNORMAL HIGH (ref 0.44–1.00)
GFR, Estimated: >60 mL/min (ref >60)
Glucose, Bld: 97 mg/dL (ref 70–99)
Potassium: 3.7 mmol/L (ref 3.5–5.1)
Sodium: 135 mmol/L (ref 135–145)
Total Bilirubin: 1.1 mg/dL (ref 0.0–1.2)
Total Protein: 7.7 g/dL (ref 6.5–8.1)

## 2023-10-01 LAB — PROTIME-INR
INR: 1.2 (ref 0.8–1.2)
Prothrombin Time: 15.5 s — ABNORMAL HIGH (ref 11.4–15.2)

## 2023-10-01 LAB — CBC WITH DIFFERENTIAL/PLATELET
Abs Immature Granulocytes: 0.03 K/uL (ref 0.00–0.07)
Basophils Absolute: 0.1 K/uL (ref 0.0–0.1)
Basophils Relative: 1 %
Eosinophils Absolute: 0 K/uL (ref 0.0–0.5)
Eosinophils Relative: 0 %
HCT: 37.8 % (ref 36.0–46.0)
Hemoglobin: 13 g/dL (ref 12.0–15.0)
Immature Granulocytes: 0 %
Lymphocytes Relative: 8 %
Lymphs Abs: 0.9 K/uL (ref 0.7–4.0)
MCH: 32.3 pg (ref 26.0–34.0)
MCHC: 34.4 g/dL (ref 30.0–36.0)
MCV: 93.8 fL (ref 80.0–100.0)
Monocytes Absolute: 0.9 K/uL (ref 0.1–1.0)
Monocytes Relative: 9 %
Neutro Abs: 9 K/uL — ABNORMAL HIGH (ref 1.7–7.7)
Neutrophils Relative %: 82 %
Platelets: 308 K/uL (ref 150–400)
RBC: 4.03 MIL/uL (ref 3.87–5.11)
RDW: 13.4 % (ref 11.5–15.5)
WBC: 11 K/uL — ABNORMAL HIGH (ref 4.0–10.5)
nRBC: 0 % (ref 0.0–0.2)

## 2023-10-01 LAB — RESP PANEL BY RT-PCR (RSV, FLU A&B, COVID)  RVPGX2
Influenza A by PCR: NEGATIVE
Influenza B by PCR: NEGATIVE
Resp Syncytial Virus by PCR: NEGATIVE
SARS Coronavirus 2 by RT PCR: NEGATIVE

## 2023-10-01 LAB — TROPONIN I (HIGH SENSITIVITY)
Troponin I (High Sensitivity): 2 ng/L (ref <18)
Troponin I (High Sensitivity): <2 ng/L (ref <18)

## 2023-10-01 LAB — LACTIC ACID, PLASMA
Lactic Acid, Venous: 1.4 mmol/L (ref 0.5–1.9)
Lactic Acid, Venous: 0.9 mmol/L (ref 0.5–1.9)

## 2023-10-01 MED ORDER — IBUPROFEN 600 MG PO TABS
600.0000 mg | ORAL_TABLET | Freq: Once | ORAL | Status: AC
  Administered 2023-10-01: 600 mg via ORAL
  Filled 2023-10-01: qty 1

## 2023-10-01 NOTE — ED Triage Notes (Signed)
 Pt to triage via wheelchair.  Pt has intermittent chest pain that began this am.  Pt also has sob.  Fever today.  Pt has nausea.  No vomiting.  Pt alert

## 2023-10-01 NOTE — ED Provider Notes (Signed)
 [facility] Provider Note   Event Date/Time   First MD Initiated Contact with Patient 10/01/23 2010     (approximate) History  Chest Pain  HPI Skylin Kennerson is a 38 y.o. female with no stated past medical history presents complaining of subjective fever, palpitations, and generalized muscle pain that has been present over the last 2 days.  Patient states that her palpitations began this morning that she describes it as intermittent with associated chest discomfort.  Patient states that these last approximately 15 minutes and resolved spontaneously.  Patient denies any exacerbating or relieving factors.  Patient denies any exertional worsening or Associated shortness of breath.  Patient currently denies any chest pain or palpitations.  Of note patient has had generalized weakness, subjective fevers, nonproductive cough, and mild sore throat over the last 2 days.  Patient states that she works at [employer] and has been in contact with multiple people with upper respiratory symptoms. ROS: Patient currently denies any vision changes, tinnitus, difficulty speaking, facial droop, sore throat, chest pain, abdominal pain, nausea/vomiting/diarrhea, dysuria, or numbness/paresthesias in any extremity   Physical Exam  Triage Vital Signs: ED Triage Vitals  Encounter Vitals Group     BP 10/01/23 1848 120/72     Girls Systolic BP Percentile --      Girls Diastolic BP Percentile --      Boys Systolic BP Percentile --      Boys Diastolic BP Percentile --      Pulse Rate 10/01/23 1848 (!) 105     Resp 10/01/23 1848 (!) 22     Temp 10/01/23 1848 (!) 101.1 F (38.4 C)     Temp Source 10/01/23 1848 Oral     SpO2 10/01/23 1848 100 %     Weight 10/01/23 1849 160 lb (72.6 kg)     Height 10/01/23 1849 5' 6 (1.676 m)     Head Circumference --      Peak Flow --      Pain Score 10/01/23 1849 8     Pain Loc --      Pain Education --      Exclude from Growth Chart --    Most  recent vital signs: Vitals:   10/01/23 1848  BP: 120/72  Pulse: (!) 105  Resp: (!) 22  Temp: (!) 101.1 F (38.4 C)  SpO2: 100%   General: Awake, oriented x4. CV:  Good peripheral perfusion.  No MGR Resp:  Normal effort.  CTAB Abd:  No distention. Other:  Middle-aged overweight Caucasian female resting comfortably in no acute distress ED Results / Procedures / Treatments  Labs (all labs ordered are listed, but only abnormal results are displayed) Labs Reviewed  COMPREHENSIVE METABOLIC PANEL WITH GFR - Abnormal; Notable for the following components:      Result Value   Creatinine, Ser 1.01 (*)    All other components within normal limits  CBC WITH DIFFERENTIAL/PLATELET - Abnormal; Notable for the following components:   WBC 11.0 (*)    Neutro Abs 9.0 (*)    All other components within normal limits  PROTIME-INR - Abnormal; Notable for the following components:   Prothrombin Time 15.5 (*)    All other components within normal limits  RESP PANEL BY RT-PCR (RSV, FLU A&B, COVID)  [id number]  CULTURE, BLOOD (ROUTINE X 2)  CULTURE, BLOOD (ROUTINE X 2)  LACTIC ACID, PLASMA  LACTIC ACID, PLASMA  URINALYSIS, W/ REFLEX TO CULTURE (INFECTION SUSPECTED)  TROPONIN I (HIGH  SENSITIVITY)  TROPONIN I (HIGH SENSITIVITY)   EKG ED ECG REPORT I, Artist MARLA Kerns, the attending physician, personally viewed and interpreted this ECG. Date: 10/01/2023 EKG Time: 1851 Rate: 106 Rhythm: Tachycardic sinus rhythm QRS Axis: normal Intervals: normal ST/T Wave abnormalities: normal Narrative Interpretation: Tachycardic sinus rhythm.  No evidence of acute ischemia RADIOLOGY ED MD interpretation: 2 view chest x-ray shows no evidence of acute abnormalities - All radiology independently interpreted and agree with radiology assessment Official radiology report(s): [name] Result Date: 10/01/2023 CLINICAL DATA:  Chest pain EXAM: DG CHEST 2V COMPARISON:  03/30/2019 FINDINGS: The heart size and  mediastinal contours are within normal limits. Both lungs are clear. The visualized skeletal structures are unremarkable. IMPRESSION: No active cardiopulmonary disease. Electronically Signed   By: Franky Crease M.D.   On: 10/01/2023 19:10   PROCEDURES: Critical Care performed: No Procedures MEDICATIONS ORDERED IN ED: Medications - No data to display IMPRESSION / MDM / ASSESSMENT AND PLAN / ED COURSE  I reviewed the triage vital signs and the nursing notes.                             The patient is on the cardiac monitor to evaluate for evidence of arrhythmia and/or significant heart rate changes. Patient's presentation is most consistent with acute presentation with potential threat to life or bodily function. Patient is a 38 year old female with the above-stated past medical history presents complaining of palpitations with associated chest pain in the setting of of generalized weakness, subjective fevers, and positive sick contact exposure DDx: Arrhythmia, pneumonia, ACS, URI, ectopic heartbeats Plan: CBC showing mild leukocytosis at 11 CMP shows no evidence of acute abnormalities INR 1.2 Lactic acid 1.4, troponin 2 COVID/flu/RSV negative Chest x-ray clear EKG shows no ectopy or ischemia  Patient shows signs and symptoms of likely viral infection given patient's fever, mild leukocytosis, generalized weakness, and likely causing palpitations.  Patient plan for discharge home at this time with follow-up.  As patient states that she does not have a primary care physician that she sees regularly, will provide ambulatory referral for PCP.  Patient given strict return precautions and all questions answered prior to discharge.  Dispo: Discharge home with PCP follow-up   FINAL CLINICAL IMPRESSION(S) / ED DIAGNOSES   Final diagnoses:  Atypical chest pain  Palpitations  Fever, unspecified fever cause  Generalized weakness   Rx / DC Orders   ED Discharge Orders     None      Note:   This document was prepared using Dragon voice recognition software and may include unintentional dictation errors.   Starlette Thurow K, MD 10/01/23 [phone]

## 2023-10-04 ENCOUNTER — Other Ambulatory Visit: Payer: Self-pay

## 2023-10-04 ENCOUNTER — Encounter: Payer: Self-pay | Admitting: Emergency Medicine

## 2023-10-04 DIAGNOSIS — R109 Unspecified abdominal pain: Secondary | ICD-10-CM | POA: Diagnosis not present

## 2023-10-04 DIAGNOSIS — Z5321 Procedure and treatment not carried out due to patient leaving prior to being seen by health care provider: Secondary | ICD-10-CM | POA: Diagnosis not present

## 2023-10-04 DIAGNOSIS — R509 Fever, unspecified: Secondary | ICD-10-CM | POA: Insufficient documentation

## 2023-10-04 MED ORDER — ACETAMINOPHEN 325 MG PO TABS
650.0000 mg | ORAL_TABLET | Freq: Once | ORAL | Status: AC | PRN
  Administered 2023-10-04: 650 mg via ORAL
  Filled 2023-10-04: qty 2

## 2023-10-04 NOTE — ED Notes (Addendum)
 BIB [facility] from home. Seen here on Friday was told she had a lung infection but wasn't given anything for it. Fever and abd pain all day today. Pt was NEG on Friday for FLU/COVID/RSV. Her work note ran out today. 102.2 T  110 HR  100% RA  Pt is CAOx4 and in no acute distress.

## 2023-10-04 NOTE — ED Triage Notes (Signed)
 BIB [facility] for fever, generalized body aches. Was seen here for same on Friday, pain hasn't gotten better.  Per pt my back wasn't hurting when I came on Friday.  Last took ibuprofen  around 11 this morning

## 2023-10-05 ENCOUNTER — Emergency Department
Admission: EM | Admit: 2023-10-05 | Discharge: 2023-10-05 | Discharge status: Against Medical Advice | Attending: Emergency Medicine | Admitting: Emergency Medicine

## 2023-10-06 LAB — CULTURE, BLOOD (ROUTINE X 2)
Culture: NO GROWTH
Culture: NO GROWTH
Special Requests: ADEQUATE
Special Requests: ADEQUATE
